# Patient Record
Sex: Female | Born: 1965 | Race: Black or African American | Hispanic: No | Marital: Married | Smoking: Never smoker
Health system: Southern US, Community
[De-identification: ages and names within clinical notes are randomized; demographics above are authoritative.]

## PROBLEM LIST (undated history)

## (undated) DIAGNOSIS — D649 Anemia, unspecified: Secondary | ICD-10-CM

## (undated) DIAGNOSIS — R0602 Shortness of breath: Secondary | ICD-10-CM

## (undated) HISTORY — DX: Anemia, unspecified: D64.9

## (undated) HISTORY — PX: TUBAL LIGATION: SHX77

---

## 1998-09-09 ENCOUNTER — Other Ambulatory Visit: Admission: RE | Admit: 1998-09-09 | Discharge: 1998-09-09 | Payer: Self-pay | Admitting: Obstetrics & Gynecology

## 1999-04-12 ENCOUNTER — Inpatient Hospital Stay (HOSPITAL_COMMUNITY): Admission: AD | Admit: 1999-04-12 | Discharge: 1999-04-15 | Payer: Self-pay | Admitting: Obstetrics & Gynecology

## 1999-04-16 ENCOUNTER — Encounter: Admission: RE | Admit: 1999-04-16 | Discharge: 1999-05-30 | Payer: Self-pay | Admitting: Obstetrics & Gynecology

## 1999-05-09 ENCOUNTER — Other Ambulatory Visit: Admission: RE | Admit: 1999-05-09 | Discharge: 1999-05-09 | Payer: Self-pay | Admitting: Obstetrics and Gynecology

## 2000-05-13 ENCOUNTER — Other Ambulatory Visit: Admission: RE | Admit: 2000-05-13 | Discharge: 2000-05-13 | Payer: Self-pay | Admitting: Obstetrics & Gynecology

## 2002-05-17 ENCOUNTER — Other Ambulatory Visit: Admission: RE | Admit: 2002-05-17 | Discharge: 2002-05-17 | Payer: Self-pay | Admitting: Obstetrics & Gynecology

## 2002-06-15 ENCOUNTER — Ambulatory Visit (HOSPITAL_COMMUNITY): Admission: RE | Admit: 2002-06-15 | Discharge: 2002-06-15 | Payer: Self-pay | Admitting: Obstetrics & Gynecology

## 2012-03-30 ENCOUNTER — Ambulatory Visit (INDEPENDENT_AMBULATORY_CARE_PROVIDER_SITE_OTHER): Payer: PRIVATE HEALTH INSURANCE | Admitting: Family Medicine

## 2012-03-30 VITALS — BP 121/87 | HR 84 | Temp 98.9°F | Resp 18 | Ht 66.5 in | Wt 236.0 lb

## 2012-03-30 DIAGNOSIS — R42 Dizziness and giddiness: Secondary | ICD-10-CM

## 2012-03-30 DIAGNOSIS — D62 Acute posthemorrhagic anemia: Secondary | ICD-10-CM

## 2012-03-30 DIAGNOSIS — D473 Essential (hemorrhagic) thrombocythemia: Secondary | ICD-10-CM

## 2012-03-30 DIAGNOSIS — Z8669 Personal history of other diseases of the nervous system and sense organs: Secondary | ICD-10-CM

## 2012-03-30 DIAGNOSIS — D649 Anemia, unspecified: Secondary | ICD-10-CM

## 2012-03-30 DIAGNOSIS — H538 Other visual disturbances: Secondary | ICD-10-CM

## 2012-03-30 LAB — POCT CBC
Granulocyte percent: 59.5 %G (ref 37–80)
Hemoglobin: 10.1 g/dL — AB (ref 12.2–16.2)
MCV: 84.3 fL (ref 80–97)
MID (cbc): 0.4 (ref 0–0.9)
Platelet Count, POC: 914 10*3/uL — AB (ref 142–424)
RBC: 3.91 M/uL — AB (ref 4.04–5.48)

## 2012-03-30 NOTE — Progress Notes (Signed)
Subjective:    Patient ID: Kristin Gates, female    DOB: 03/27/1965, 47 y.o.   MRN: [id number]  HPI Kristin Gates is a 47 y.o. female C/o lightheadedness since September of last year.  Primary care provider - Dr. Allyne Gee - saw pt. Then - had EKG and bloodwork - checked for diabetes and thyroid - told all was normal.  Lightheadedness on and off.  Notes when walking to top or bottom of 12 steps. Humming sound in ears and dizzy - has to sit down.  Occurs 2-3 times per week. No recent changes in frequency or severity - just persistent. Dizzy with first getting up at times.   Noted blurry vision -  One episode 5 days ago - lasted about 30 minutes - both eyes.  Noticed again this am - "halo" appearance or distorted of visual field - lasted about 5-10 minutes.  Wears contact lenses - last visit with optho 1 year ago.  No chest pains.  Had headache with blurry vision last Friday - not recently. No current headache, no chest pain, no palpitations.  No amaurosis fugax.   Hx of increased stress - mom is ill with cancer.  Discussed with primary provider.  Denies depressed mood.  No SI. No anhedonia.   Currently on menses - these have been normal.  No blood in stool.  No hx of anemia.   Review of Systems  Constitutional: Negative for fever and diaphoresis.  HENT: Positive for tinnitus (intermittnet  buzzing only when dizzy. ). Negative for hearing loss, neck pain, neck stiffness and ear discharge.   Eyes: Positive for visual disturbance. Negative for photophobia.  Respiratory: Negative for chest tightness and shortness of breath.   Cardiovascular: Negative for chest pain and palpitations.  Gastrointestinal: Negative for blood in stool.  Genitourinary: Negative for menstrual problem.  Neurological: Positive for dizziness, light-headedness and headaches. Negative for tremors, seizures, syncope and speech difficulty.  Psychiatric/Behavioral: Negative for suicidal ideas and dysphoric mood. The patient  is not nervous/anxious.        Objective:   Physical Exam  Vitals reviewed. Constitutional: She is oriented to person, place, and time. She appears well-developed and well-nourished. No distress.  HENT:  Head: Normocephalic and atraumatic.  Eyes: Conjunctivae normal and EOM are normal. Pupils are equal, round, and reactive to light. Right eye exhibits no nystagmus. Left eye exhibits no nystagmus.  Neck: Trachea normal. Carotid bruit is not present. No mass and no thyromegaly present.  Cardiovascular: Normal rate, regular rhythm, normal heart sounds and intact distal pulses.   No murmur heard. Pulmonary/Chest: Effort normal and breath sounds normal.  Abdominal: Soft. She exhibits no pulsatile midline mass. There is no tenderness.  Neurological: She is alert and oriented to person, place, and time. She has normal strength. She displays no tremor. No sensory deficit. She displays a negative Romberg sign. Coordination and gait normal.  Reflex Scores:      Tricep reflexes are 2+ on the right side and 2+ on the left side.      Bicep reflexes are 2+ on the right side and 2+ on the left side.      Brachioradialis reflexes are 2+ on the right side and 2+ on the left side.      Patellar reflexes are 2+ on the right side and 2+ on the left side.      Achilles reflexes are 2+ on the right side and 2+ on the left side.      Normal  heel to toe, negative pronator drift. nonfocal neuro exam.   Skin: Skin is warm and dry.  Psychiatric: She has a normal mood and affect. Her behavior is normal.      Results for orders placed in visit on 03/30/12  POCT CBC      Component Value Range   WBC 5.1  4.6 - 10.2 K/uL   Lymph, poc 1.7  0.6 - 3.4   POC LYMPH PERCENT 33.2  10 - 50 %L   MID (cbc) 0.4  0 - 0.9   POC MID % 7.3  0 - 12 %M   POC Granulocyte 3.0  2 - 6.9   Granulocyte percent 59.5  37 - 80 %G   RBC 3.91 (*) 4.04 - 5.48 M/uL   Hemoglobin 10.1 (*) 12.2 - 16.2 g/dL   HCT, POC 16.1 (*) 09.6 - 47.9  %   MCV 84.3  80 - 97 fL   MCH, POC 25.8 (*) 27 - 31.2 pg   MCHC 30.6 (*) 31.8 - 35.4 g/dL   RDW, POC 04.5     Platelet Count, POC 914 (*) 142 - 424 K/uL   MPV 7.4  0 - 99.8 fL   Further history at discussion of above CBC - has had anemia in past, took iron, low to almost having a transfusion, but has not had a transfusion, and was normal at September labs off iron.      Assessment & Plan:  Kristin Gates is a 47 y.o. female 1. Episodic lightheadedness  POCT CBC, CT Head Wo Contrast, TSH.  3-4 months of persistent, but not worsening intermittent symptoms.  Only new sx of 2 episodes of transient vision change, asymptomatic currently and nonfocal neuro exam.  Possibly due to anemia, but will check head CT tomorrow, and other labs as below. Social/family stressors, but denied depression sx's currently.   2. History of blurry vision  POCT CBC, CT Head Wo Contrast, TSH.  Er precautions discussed.  Plan to follow up with optho as well - pt to call tomorrow to schedule.   3. Anemia  Hx of anemia in past, but by history was normal in September 2013. Possible contributor to above.  Vitamin B12, Reticulocytes, Iron, IFOBT POC (occult bld, rslt in office), Pathologist smear review. Will discuss follow up after labs or can be determined by primary care provider.   4. Thrombocytosis  Pathologist smear review.    Patient Instructions  We will call you about the cat scan of the head tomorrow. If this is normal, can follow up with your primary care provider to discuss further workup as well as discuss your anemia. You should also call your eye care provider for an evaluation with your recent vision symptoms. Return to the clinic or go to the nearest emergency room if any of your symptoms worsen or new symptoms occur.

## 2012-03-30 NOTE — Patient Instructions (Addendum)
We will call you about the cat scan of the head tomorrow. If this is normal, can follow up with your primary care provider to discuss further workup as well as discuss your anemia. You should also call your eye care provider for an evaluation with your recent vision symptoms. Return to the clinic or go to the nearest emergency room if any of your symptoms worsen or new symptoms occur.

## 2012-03-31 ENCOUNTER — Telehealth: Payer: Self-pay | Admitting: Radiology

## 2012-03-31 DIAGNOSIS — R42 Dizziness and giddiness: Secondary | ICD-10-CM

## 2012-03-31 LAB — IRON: Iron: 29 ug/dL — ABNORMAL LOW (ref 42–145)

## 2012-03-31 LAB — VITAMIN B12: Vitamin B-12: 910 pg/mL (ref 211–911)

## 2012-03-31 NOTE — Telephone Encounter (Signed)
Patients MRI scan can be done [facility] Auth# [id number] good from today until 05/01/12 Programme researcher, broadcasting/film/video at [place]. [name] prefers for MRI instead of the CT scan. Patient is not claustrophobic and does not have any metal anywhere of any concern. Order placed, patient advised. Please cancel CT scan from work que. Kristin Gates

## 2012-04-02 LAB — RETICULOCYTES: Retic Ct Pct: 2.9 % — ABNORMAL HIGH (ref 0.4–2.3)

## 2012-04-08 ENCOUNTER — Ambulatory Visit
Admission: RE | Admit: 2012-04-08 | Discharge: 2012-04-08 | Disposition: A | Discharge status: Home / Self Care | Payer: PRIVATE HEALTH INSURANCE | Source: Ambulatory Visit | Attending: Family Medicine | Admitting: Family Medicine

## 2012-04-08 DIAGNOSIS — R42 Dizziness and giddiness: Secondary | ICD-10-CM

## 2012-04-14 ENCOUNTER — Ambulatory Visit
Admission: RE | Admit: 2012-04-14 | Discharge: 2012-04-14 | Disposition: A | Discharge status: Home / Self Care | Payer: PRIVATE HEALTH INSURANCE | Source: Ambulatory Visit | Attending: Family Medicine | Admitting: Family Medicine

## 2012-05-09 ENCOUNTER — Ambulatory Visit: Payer: PRIVATE HEALTH INSURANCE | Admitting: Family Medicine

## 2012-05-30 ENCOUNTER — Ambulatory Visit (INDEPENDENT_AMBULATORY_CARE_PROVIDER_SITE_OTHER): Payer: PRIVATE HEALTH INSURANCE | Admitting: Family Medicine

## 2012-05-30 ENCOUNTER — Encounter: Payer: Self-pay | Admitting: Family Medicine

## 2012-05-30 VITALS — BP 139/84 | HR 86 | Temp 98.4°F | Resp 16 | Ht 66.0 in | Wt 244.0 lb

## 2012-05-30 DIAGNOSIS — D649 Anemia, unspecified: Secondary | ICD-10-CM

## 2012-05-30 DIAGNOSIS — Z789 Other specified health status: Secondary | ICD-10-CM

## 2012-05-30 DIAGNOSIS — R42 Dizziness and giddiness: Secondary | ICD-10-CM

## 2012-05-30 DIAGNOSIS — R29818 Other symptoms and signs involving the nervous system: Secondary | ICD-10-CM

## 2012-05-30 LAB — CBC WITH DIFFERENTIAL/PLATELET
Basophils Absolute: 0 10*3/uL (ref 0.0–0.1)
Basophils Relative: 0 % (ref 0–1)
MCHC: 33.1 g/dL (ref 30.0–36.0)
Neutro Abs: 2.2 10*3/uL (ref 1.7–7.7)
Neutrophils Relative %: 60 % (ref 43–77)
Platelets: 428 10*3/uL — ABNORMAL HIGH (ref 150–400)
RDW: 19.2 % — ABNORMAL HIGH (ref 11.5–15.5)
WBC: 3.6 10*3/uL — ABNORMAL LOW (ref 4.0–10.5)

## 2012-05-30 NOTE — Patient Instructions (Addendum)
We will refer you to a neurologist and recheck your blood count today. Your should receive a call or letter about your lab results within the next week to 10 days.  You can call the counselor below to schedule appointment to discuss stress management.    [facility] Ped or [place] Scrape: [phone] Monica Becton Young: [phone]  Return to the clinic or go to the nearest emergency room if any of your symptoms worsen or new symptoms occur.

## 2012-05-30 NOTE — Progress Notes (Signed)
Subjective:    Patient ID: Kristin Gates, female    DOB: [birth year], 47 y.o.   MRN: [id number]  HPI Kristin Gates is a 48 y.o. female See 03/30/12 ov. At that time discussed longstanding dizziness. 2 episodes of transient vision change, asymptomatic at OV.  Noted to have anemia (Hgb 10.1), and thrombocytosis.  Plan to follow up with optho to eval vision change, and to follow up with her primary provider to discuss anemia and dizziness.  MRI brain 04/18/12:  Findings: Ventricle size is normal. Craniocervical junction is normal. Pituitary gland is normal in size.  Negative for acute or chronic infarct. Cerebral white matter and brainstem are normal. Negative for demyelinating disease. Negative for hemorrhage or fluid collection. Negative for mass or edema. No shift of the midline structures. Paranasal sinuses are clear.  IMPRESSION: Normal Results for orders placed in visit on 03/30/12  TSH      Result Value Range   TSH 2.169  0.350 - 4.500 uIU/mL  VITAMIN B12      Result Value Range   Vitamin B-12 910  211 - 911 pg/mL  RETICULOCYTES      Result Value Range   Retic Ct Pct 2.9 (*) 0.4 - 2.3 %   RBC. 3.69 (*) 3.87 - 5.11 MIL/uL   ABS Retic 107.0  19.0 - 186.0 K/uL  IRON      Result Value Range   Iron 29 (*) 42 - 145 ug/dL  PATHOLOGIST SMEAR REVIEW      Result Value Range   Path Review      POCT CBC      Result Value Range   WBC 5.1  4.6 - 10.2 K/uL   Lymph, poc 1.7  0.6 - 3.4   POC LYMPH PERCENT 33.2  10 - 50 %L   MID (cbc) 0.4  0 - 0.9   POC MID % 7.3  0 - 12 %M   POC Granulocyte 3.0  2 - 6.9   Granulocyte percent 59.5  37 - 80 %G   RBC 3.91 (*) 4.04 - 5.48 M/uL   Hemoglobin 10.1 (*) 12.2 - 16.2 g/dL   HCT, POC 78.2 (*) 95.6 - 47.9 %   MCV 84.3  80 - 97 fL   MCH, POC 25.8 (*) 27 - 31.2 pg   MCHC 30.6 (*) 31.8 - 35.4 g/dL   RDW, POC 21.3     Platelet Count, POC 914 (*) 142 - 424 K/uL   MPV 7.4  0 - 99.8 fL   Anemia - see last ov. Plan to restart iron QD. Started on  Vytron C - otc iron supplement - unknown mg - taking once per day since last ov.  Next appt with Dr. Allyne Gee April 22nd, but had only established care 1 prior visit in 9/13. Still feels dizzy at times.  No further vision changes.  Notes at times with going up and down stairs multiple times, but not at rest and not with walking across parking lot. No DOE. Still feels fatigued, and feels like muscles in legs are heavy/fatigued. No hand weakness able to grasp, but feels like less control when writing or using a pen. These sx's for months - comes and goes, feels like different. No chest pain or heaviness.   Denies depression, but some stress with job. Mom is sick. Busy with normal activities, no SI, or concerns for safety.  No illicit drug use, no alcohol. Rare crying, but feels may be overloaded.  Review of Systems  Constitutional: Negative for fever.  Eyes: Negative for visual disturbance.  Respiratory: Negative for chest tightness and shortness of breath.   Cardiovascular: Negative for chest pain.  Neurological: Positive for dizziness. Negative for tremors, seizures, syncope, facial asymmetry, speech difficulty and weakness (no focal weakness. ).       Objective:   Physical Exam  Vitals reviewed. Constitutional: She is oriented to person, place, and time. She appears well-developed and well-nourished.  HENT:  Head: Normocephalic and atraumatic.  Eyes: Conjunctivae and EOM are normal. Pupils are equal, round, and reactive to light. Right eye exhibits no nystagmus. Left eye exhibits no nystagmus.  Neck: Carotid bruit is not present.  Cardiovascular: Normal rate, regular rhythm, normal heart sounds and intact distal pulses.   Pulmonary/Chest: Effort normal and breath sounds normal.  Abdominal: Soft. She exhibits no pulsatile midline mass. There is no tenderness.  Neurological: She is alert and oriented to person, place, and time. She has normal strength. No sensory deficit. She displays a  negative Romberg sign. Coordination and gait normal.  Normal heel to toe, normal finger to nose and heel slide up up shin, normal RAM's. No pronator drift. Non focal.   Skin: Skin is warm and dry.  Psychiatric: She has a normal mood and affect. Her behavior is normal.          Assessment & Plan:  Kristin Gates is a 47 y.o. female Anemia - Plan: CBC with Differential.  Continue iron each day for now. Keep follow up with PCP or me in next month.   Dizziness and giddiness - Plan: CBC with Differential, Ambulatory referral to Neurology, Fine motor skill loss - Plan: Ambulatory referral to Neurology.  Reassuring exam and MRI, but will have neuro eval.   Possible stress component - plan to setup counseling as below.   rtc precautions.     Patient Instructions  We will refer you to a neurologist and recheck your blood count today. Your should receive a call or letter about your lab results within the next week to 10 days.  You can call the counselor below to schedule appointment to discuss stress management.    [facility] Ped or [place] Scrape: [phone] Monica Becton Young: [phone]  Return to the clinic or go to the nearest emergency room if any of your symptoms worsen or new symptoms occur.

## 2012-06-03 ENCOUNTER — Other Ambulatory Visit: Payer: Self-pay

## 2012-06-03 DIAGNOSIS — Z1231 Encounter for screening mammogram for malignant neoplasm of breast: Secondary | ICD-10-CM

## 2012-06-21 ENCOUNTER — Encounter: Payer: Self-pay | Admitting: Diagnostic Neuroimaging

## 2012-06-21 ENCOUNTER — Ambulatory Visit (INDEPENDENT_AMBULATORY_CARE_PROVIDER_SITE_OTHER): Payer: PRIVATE HEALTH INSURANCE | Admitting: Diagnostic Neuroimaging

## 2012-06-21 VITALS — BP 125/88 | HR 82 | Temp 98.5°F | Ht 65.5 in | Wt 243.0 lb

## 2012-06-21 DIAGNOSIS — R5381 Other malaise: Secondary | ICD-10-CM

## 2012-06-21 DIAGNOSIS — R42 Dizziness and giddiness: Secondary | ICD-10-CM

## 2012-06-21 DIAGNOSIS — R5383 Other fatigue: Secondary | ICD-10-CM

## 2012-06-21 NOTE — Progress Notes (Signed)
[facility]  PATIENT: Kristin Gates DOB: 09-Nov-1965  REFERRING CLINICIAN: Neva Seat HISTORY FROM: patient REASON FOR VISIT: new consult   HISTORICAL  CHIEF COMPLAINT:  Chief Complaint  Patient presents with  . Dizziness    HISTORY OF PRESENT ILLNESS:   47 year old right-handed female with iron deficiency anemia, here for evaluation of lightheadedness, muscle weakness, tremor, fatigue. Symptoms have been present for approximately one year. She is noted change in her handwriting, fine finger movements, generalized muscle weakness with walking and getting up and down from her chair. She also has intermittent lightheadedness when she stands up. She denies decreased by mouth intake with dehydration.  In January or February 2014, patient had a single episode of visual distortion, while talking with a Radio broadcast assistant. It looked as though she was looking through water running down the piece of glass. His lasted for a few minutes. She had headache during the stay. No nausea, vomiting, photophobia, phonophobia or dizziness.  Patient has had MRI of the brain which was normal. TSH, B12 also normal.  REVIEW OF SYSTEMS: Full 14 system review of systems performed and notable only for fatigue hearing loss ringing in the ears distorted vision shortness of breath anemia cramps weakness slurred speech tremor anxiety decreased energy.  ALLERGIES: No Known Allergies  HOME MEDICATIONS: Outpatient Prescriptions Prior to Visit  Medication Sig Dispense Refill  . Iron-Vitamin C (VITRON-C PO) Take by mouth.      . magnesium 30 MG tablet Take 30 mg by mouth 2 (two) times daily.      . Multiple Vitamin (MULTIVITAMIN) tablet Take 1 tablet by mouth daily.       No facility-administered medications prior to visit.    PAST MEDICAL HISTORY: Past Medical History  Diagnosis Date  . Anemia     PAST SURGICAL HISTORY: Past Surgical History  Procedure Laterality Date  . Cesarean section      . Tubal ligation      FAMILY HISTORY: Family History  Problem Relation Age of Onset  . Cancer Mother   . Hypertension Mother   . Heart disease Father   . Hypertension Father   . Diabetes Father   . Heart failure Father   . Asthma Sister     SOCIAL HISTORY:  History   Social History  . Marital Status: Married    Spouse Name: N/A    Number of Children: 1  . Years of Education: college    Occupational History  . collector     [employer]   Social History Main Topics  . Smoking status: Never Smoker   . Smokeless tobacco: Not on file  . Alcohol Use: 1 - 1.5 oz/week    2-3 drink(s) per week     Comment: once or twice a month  . Drug Use: No  . Sexually Active: Yes    Birth Control/ Protection: None   Other Topics Concern  . Not on file   Social History Narrative   Pt lives at home with her spouse and daughter.   Caffeine Use- 6 to 8oz's daily     PHYSICAL EXAM  Filed Vitals:   06/21/12 0911 06/21/12 0923  BP:  125/88  Pulse:  82  Temp: 98.5 F (36.9 C)   TempSrc: Oral   Height: 5' 5.5" (1.664 m)   Weight: 243 lb (110.224 kg)    Body mass index is 39.81 kg/(m^2).  GENERAL EXAM: Patient is in no distress  CARDIOVASCULAR: Regular rate and rhythm, no murmurs,  no carotid bruits  NEUROLOGIC: MENTAL STATUS: awake, alert, language fluent, comprehension intact, naming intact CRANIAL NERVE: no papilledema on fundoscopic exam, pupils equal and reactive to light, visual fields full to confrontation, extraocular muscles intact, no nystagmus, facial sensation and strength symmetric, uvula midline, shoulder shrug symmetric, tongue midline. MOTOR: MINIMAL POSTURAL TREMOR. Normal bulk and tone, full strength in the BUE, BLE SENSORY: normal and symmetric to light touch, pinprick, temperature, vibration COORDINATION: finger-nose-finger, fine finger movements normal REFLEXES: deep tendon reflexes present and symmetric GAIT/STATION: narrow based gait; able to walk on  toes, heels and tandem; romberg is negative   DIAGNOSTIC DATA (LABS, IMAGING, TESTING) - I reviewed patient records, labs, notes, testing and imaging myself where available.  Lab Results  Component Value Date   WBC 3.6* 05/30/2012   HGB 11.7* 05/30/2012   HCT 35.3* 05/30/2012   MCV 84.4 05/30/2012   PLT 428* 05/30/2012   No results found for this basename: na, k, cl, co2, glucose, bun, creatinine, calcium, prot, albumin, ast, alt, alkphos, bilitot, gfrnonaa, gfraa   No results found for this basename: CHOL, HDL, LDLCALC, LDLDIRECT, TRIG, CHOLHDL   No results found for this basename: HGBA1C   Lab Results  Component Value Date   VITAMINB12 910 03/30/2012   Lab Results  Component Value Date   TSH 2.169 03/30/2012   04/14/12 MRI brain - normal  ASSESSMENT AND PLAN  47 y.o. year old female  has a past medical history of Anemia. here with constellation of symptoms including transient visual distortion, generalized fatigue, intermittent dizziness. Her visual distortion episode sounds like migraine phenomenon. Her generalized fatigue may be related to iron deficiency anemia. She had 16 point drop in systolic blood pressure with standing with appropriate compensatory tachycardia. I will check some blood work to look for other causes for weakness. Overall MRI and neurologic examination are reassuring.  I encouraged patient to increase her water intake and gradually increase her physical activity.  Orders Placed This Encounter  Procedures  . CK  . Aldolase  . Acetylcholine Receptor, Binding      Suanne Marker, MD 06/21/2012, 9:45 AM Certified in Neurology, Neurophysiology and Neuroimaging  [facility] [address], [address] [place], [place] [address] [phone]

## 2012-06-21 NOTE — Patient Instructions (Signed)
Stay hydrated.  Gradually increase physical activity to improve stamina/endurance.

## 2012-06-22 LAB — CK: Total CK: 74 U/L (ref 24–173)

## 2012-06-22 LAB — ALDOLASE: Aldolase: 3.2 U/L (ref 1.2–7.6)

## 2012-07-07 ENCOUNTER — Ambulatory Visit
Admission: RE | Admit: 2012-07-07 | Discharge: 2012-07-07 | Disposition: A | Discharge status: Home / Self Care | Payer: PRIVATE HEALTH INSURANCE | Source: Ambulatory Visit

## 2012-07-07 DIAGNOSIS — Z1231 Encounter for screening mammogram for malignant neoplasm of breast: Secondary | ICD-10-CM

## 2012-07-11 ENCOUNTER — Other Ambulatory Visit: Payer: Self-pay | Admitting: Obstetrics & Gynecology

## 2012-07-11 DIAGNOSIS — R928 Other abnormal and inconclusive findings on diagnostic imaging of breast: Secondary | ICD-10-CM

## 2012-07-20 ENCOUNTER — Ambulatory Visit
Admission: RE | Admit: 2012-07-20 | Discharge: 2012-07-20 | Disposition: A | Discharge status: Home / Self Care | Payer: PRIVATE HEALTH INSURANCE | Source: Ambulatory Visit | Attending: Obstetrics & Gynecology | Admitting: Obstetrics & Gynecology

## 2012-07-20 DIAGNOSIS — R928 Other abnormal and inconclusive findings on diagnostic imaging of breast: Secondary | ICD-10-CM

## 2012-08-10 ENCOUNTER — Ambulatory Visit
Admission: RE | Admit: 2012-08-10 | Discharge: 2012-08-10 | Disposition: A | Discharge status: Home / Self Care | Payer: PRIVATE HEALTH INSURANCE | Source: Ambulatory Visit | Attending: Cardiology | Admitting: Cardiology

## 2012-08-10 ENCOUNTER — Other Ambulatory Visit: Payer: Self-pay | Admitting: Cardiology

## 2012-08-10 DIAGNOSIS — I272 Pulmonary hypertension, unspecified: Secondary | ICD-10-CM

## 2012-08-10 DIAGNOSIS — R0602 Shortness of breath: Secondary | ICD-10-CM

## 2012-08-21 ENCOUNTER — Ambulatory Visit (INDEPENDENT_AMBULATORY_CARE_PROVIDER_SITE_OTHER): Payer: PRIVATE HEALTH INSURANCE | Admitting: Family Medicine

## 2012-08-21 ENCOUNTER — Encounter: Payer: Self-pay | Admitting: Family Medicine

## 2012-08-21 VITALS — BP 130/80 | HR 90 | Temp 98.0°F | Resp 16 | Ht 65.5 in | Wt 241.0 lb

## 2012-08-21 DIAGNOSIS — M25562 Pain in left knee: Secondary | ICD-10-CM

## 2012-08-21 DIAGNOSIS — M79609 Pain in unspecified limb: Secondary | ICD-10-CM

## 2012-08-21 MED ORDER — PREDNISONE 20 MG PO TABS: ORAL_TABLET | ORAL | Status: DC

## 2012-08-21 NOTE — Progress Notes (Signed)
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  Some crepitus.  No trauma.  No past hx of problem there.  No fever or other joint sx.  Ibuprofen not helping  Objective:  NAD Knee inspection and palpation are normal ROM is limited to 0 to 120 degrees Some crepitus is palpable behind patella Lig testing is neg. No effusion  Knee pain, acute, left - Plan: predniSONE (DELTASONE) 20 MG tablet  Signed, Elvina Sidle, MD

## 2012-09-08 ENCOUNTER — Telehealth: Payer: Self-pay | Admitting: Diagnostic Neuroimaging

## 2012-10-19 ENCOUNTER — Encounter (HOSPITAL_COMMUNITY): Payer: Self-pay | Admitting: Pharmacy Technician

## 2012-11-14 ENCOUNTER — Other Ambulatory Visit: Payer: Self-pay | Admitting: Obstetrics & Gynecology

## 2012-11-28 ENCOUNTER — Encounter (HOSPITAL_COMMUNITY): Payer: Self-pay

## 2012-11-28 ENCOUNTER — Encounter (HOSPITAL_COMMUNITY)
Admission: RE | Admit: 2012-11-28 | Discharge: 2012-11-28 | Disposition: A | Discharge status: Home / Self Care | Payer: PRIVATE HEALTH INSURANCE | Source: Ambulatory Visit | Attending: Obstetrics & Gynecology | Admitting: Obstetrics & Gynecology

## 2012-11-28 DIAGNOSIS — Z01818 Encounter for other preprocedural examination: Secondary | ICD-10-CM | POA: Insufficient documentation

## 2012-11-28 DIAGNOSIS — Z01812 Encounter for preprocedural laboratory examination: Secondary | ICD-10-CM | POA: Insufficient documentation

## 2012-11-28 HISTORY — DX: Shortness of breath: R06.02

## 2012-11-28 LAB — CBC
HCT: 36.9 % (ref 36.0–46.0)
Hemoglobin: 12.2 g/dL (ref 12.0–15.0)
MCH: 30.8 pg (ref 26.0–34.0)
MCV: 93.2 fL (ref 78.0–100.0)
RBC: 3.96 MIL/uL (ref 3.87–5.11)

## 2012-11-28 NOTE — Patient Instructions (Addendum)
Your procedure is scheduled on:12/02/12  Enter through the Main Entrance at :6am Pick up desk phone and dial [phone] and inform us of your arrival.  Please call [phone] if you have any problems the morning of surgery.  Remember: Do not eat food or drink liquids, including water, after midnight Thursday  You may brush your teeth the morning of surgery.    DO NOT wear jewelry, eye make-up, lipstick,body lotion, or dark fingernail polish.  (Polished toes are ok) You may wear deodorant.  If you are to be admitted after surgery, leave suitcase in car until your room has been assigned. Patients discharged on the day of surgery will not be allowed to drive home. Wear loose fitting, comfortable clothes for your ride home.

## 2012-12-02 ENCOUNTER — Encounter (HOSPITAL_COMMUNITY)
Admission: RE | Disposition: A | Discharge status: Home / Self Care | Payer: Self-pay | Source: Ambulatory Visit | Attending: Obstetrics & Gynecology

## 2012-12-02 ENCOUNTER — Ambulatory Visit (HOSPITAL_COMMUNITY): Payer: PRIVATE HEALTH INSURANCE | Admitting: Anesthesiology

## 2012-12-02 ENCOUNTER — Ambulatory Visit (HOSPITAL_COMMUNITY)
Admission: RE | Admit: 2012-12-02 | Discharge: 2012-12-02 | Disposition: A | Discharge status: Home / Self Care | Payer: PRIVATE HEALTH INSURANCE | Source: Ambulatory Visit | Attending: Obstetrics & Gynecology | Admitting: Obstetrics & Gynecology

## 2012-12-02 ENCOUNTER — Encounter (HOSPITAL_COMMUNITY): Payer: Self-pay | Admitting: Anesthesiology

## 2012-12-02 DIAGNOSIS — N840 Polyp of corpus uteri: Secondary | ICD-10-CM | POA: Insufficient documentation

## 2012-12-02 DIAGNOSIS — N921 Excessive and frequent menstruation with irregular cycle: Secondary | ICD-10-CM | POA: Insufficient documentation

## 2012-12-02 HISTORY — PX: DILITATION & CURRETTAGE/HYSTROSCOPY WITH VERSAPOINT RESECTION: SHX5571

## 2012-12-02 SURGERY — DILATATION & CURETTAGE/HYSTEROSCOPY WITH VERSAPOINT RESECTION
Anesthesia: General | Site: Vagina | Wound class: Clean Contaminated

## 2012-12-02 MED ORDER — MIDAZOLAM HCL 2 MG/2ML IJ SOLN
INTRAMUSCULAR | Status: AC
  Filled 2012-12-02: qty 2

## 2012-12-02 MED ORDER — SODIUM CHLORIDE 0.9 % IR SOLN
Status: DC | PRN
  Administered 2012-12-02: 3000 mL

## 2012-12-02 MED ORDER — PROPOFOL 10 MG/ML IV EMUL
INTRAVENOUS | Status: AC
  Filled 2012-12-02: qty 20

## 2012-12-02 MED ORDER — FENTANYL CITRATE 0.05 MG/ML IJ SOLN
INTRAMUSCULAR | Status: AC
  Filled 2012-12-02: qty 2

## 2012-12-02 MED ORDER — PROPOFOL 10 MG/ML IV BOLUS
INTRAVENOUS | Status: DC | PRN
  Administered 2012-12-02: 170 mg via INTRAVENOUS

## 2012-12-02 MED ORDER — KETOROLAC TROMETHAMINE 30 MG/ML IJ SOLN
INTRAMUSCULAR | Status: AC
  Filled 2012-12-02: qty 1

## 2012-12-02 MED ORDER — KETOROLAC TROMETHAMINE 30 MG/ML IJ SOLN
INTRAMUSCULAR | Status: DC | PRN
  Administered 2012-12-02: 30 mg via INTRAVENOUS

## 2012-12-02 MED ORDER — PHENYLEPHRINE 40 MCG/ML (10ML) SYRINGE FOR IV PUSH (FOR BLOOD PRESSURE SUPPORT)
PREFILLED_SYRINGE | INTRAVENOUS | Status: AC
  Filled 2012-12-02: qty 5

## 2012-12-02 MED ORDER — LACTATED RINGERS IV SOLN
INTRAVENOUS | Status: DC
  Administered 2012-12-02 (×2): via INTRAVENOUS

## 2012-12-02 MED ORDER — FENTANYL CITRATE 0.05 MG/ML IJ SOLN
INTRAMUSCULAR | Status: DC | PRN
  Administered 2012-12-02 (×2): 50 ug via INTRAVENOUS

## 2012-12-02 MED ORDER — CHLOROPROCAINE HCL 1 % IJ SOLN
INTRAMUSCULAR | Status: DC | PRN
  Administered 2012-12-02: 20 mL

## 2012-12-02 MED ORDER — FENTANYL CITRATE 0.05 MG/ML IJ SOLN: 25.0000 ug | INTRAMUSCULAR | Status: DC | PRN

## 2012-12-02 MED ORDER — MIDAZOLAM HCL 2 MG/2ML IJ SOLN
INTRAMUSCULAR | Status: DC | PRN
  Administered 2012-12-02: 2 mg via INTRAVENOUS

## 2012-12-02 MED ORDER — CEFAZOLIN SODIUM-DEXTROSE 2-3 GM-% IV SOLR
INTRAVENOUS | Status: AC
  Filled 2012-12-02: qty 50

## 2012-12-02 MED ORDER — LIDOCAINE HCL (CARDIAC) 20 MG/ML IV SOLN
INTRAVENOUS | Status: DC | PRN
  Administered 2012-12-02: 30 mg via INTRAVENOUS
  Administered 2012-12-02: 70 mg via INTRAVENOUS

## 2012-12-02 MED ORDER — ONDANSETRON HCL 4 MG/2ML IJ SOLN
INTRAMUSCULAR | Status: AC
  Filled 2012-12-02: qty 2

## 2012-12-02 MED ORDER — LIDOCAINE HCL (CARDIAC) 20 MG/ML IV SOLN
INTRAVENOUS | Status: AC
  Filled 2012-12-02: qty 5

## 2012-12-02 MED ORDER — OXYCODONE-ACETAMINOPHEN 7.5-325 MG PO TABS: 1.0000 | ORAL_TABLET | ORAL | Status: DC | PRN

## 2012-12-02 MED ORDER — CEFAZOLIN SODIUM-DEXTROSE 2-3 GM-% IV SOLR
2.0000 g | INTRAVENOUS | Status: AC
  Administered 2012-12-02: 2 g via INTRAVENOUS

## 2012-12-02 MED ORDER — PHENYLEPHRINE HCL 10 MG/ML IJ SOLN
INTRAMUSCULAR | Status: DC | PRN
  Administered 2012-12-02: 80 ug via INTRAVENOUS
  Administered 2012-12-02: 40 ug via INTRAVENOUS
  Administered 2012-12-02: 80 ug via INTRAVENOUS

## 2012-12-02 MED ORDER — CHLOROPROCAINE HCL 1 % IJ SOLN
INTRAMUSCULAR | Status: AC
  Filled 2012-12-02: qty 30

## 2012-12-02 MED ORDER — ONDANSETRON HCL 4 MG/2ML IJ SOLN
INTRAMUSCULAR | Status: DC | PRN
  Administered 2012-12-02: 4 mg via INTRAVENOUS

## 2012-12-02 SURGICAL SUPPLY — 15 items
CANISTER SUCTION 2500CC (MISCELLANEOUS) ×2 IMPLANT
CATH ROBINSON RED A/P 16FR (CATHETERS) ×2 IMPLANT
CLOTH BEACON ORANGE TIMEOUT ST (SAFETY) ×2 IMPLANT
CONTAINER PREFILL 10% NBF 60ML (FORM) ×4 IMPLANT
DRESSING TELFA 8X3 (GAUZE/BANDAGES/DRESSINGS) ×2 IMPLANT
ELECTRODE ROLLER VERSAPOINT (ELECTRODE) ×2 IMPLANT
ELECTRODE RT ANGLE VERSAPOINT (CUTTING LOOP) ×2 IMPLANT
GLOVE BIO SURGEON STRL SZ 6.5 (GLOVE) ×2 IMPLANT
GLOVE BIOGEL PI IND STRL 7.0 (GLOVE) ×2 IMPLANT
GLOVE BIOGEL PI INDICATOR 7.0 (GLOVE) ×2
GOWN STRL REIN XL XLG (GOWN DISPOSABLE) ×4 IMPLANT
PACK HYSTEROSCOPY LF (CUSTOM PROCEDURE TRAY) ×2 IMPLANT
PAD OB MATERNITY 4.3X12.25 (PERSONAL CARE ITEMS) ×2 IMPLANT
TOWEL OR 17X24 6PK STRL BLUE (TOWEL DISPOSABLE) ×4 IMPLANT
WATER STERILE IRR 1000ML POUR (IV SOLUTION) ×2 IMPLANT

## 2012-12-02 NOTE — Anesthesia Preprocedure Evaluation (Addendum)
Anesthesia Evaluation  Patient identified by MRN, date of birth, ID band Patient awake    Reviewed: Allergy & Precautions, H&P , Patient's Chart, lab work & pertinent test results, reviewed documented beta blocker date and time   Airway Mallampati: II TM Distance: >3 FB Neck ROM: full    Dental no notable dental hx.    Pulmonary  breath sounds clear to auscultation  Pulmonary exam normal       Cardiovascular Rhythm:regular Rate:Normal     Neuro/Psych    GI/Hepatic   Endo/Other  Morbid obesity  Renal/GU      Musculoskeletal   Abdominal   Peds  Hematology   Anesthesia Other Findings   Reproductive/Obstetrics                           Anesthesia Physical Anesthesia Plan  ASA: III  Anesthesia Plan:    Post-op Pain Management:    Induction: Intravenous  Airway Management Planned: LMA  Additional Equipment:   Intra-op Plan:   Post-operative Plan:   Informed Consent: I have reviewed the patients History and Physical, chart, labs and discussed the procedure including the risks, benefits and alternatives for the proposed anesthesia with the patient or authorized representative who has indicated his/her understanding and acceptance.   Dental Advisory Given and Dental advisory given  Plan Discussed with: CRNA and Surgeon  Anesthesia Plan Comments:         Anesthesia Quick Evaluation

## 2012-12-02 NOTE — Op Note (Signed)
12/02/2012  8:37 AM  PATIENT:  Kristin Gates  47 y.o. female  PRE-OPERATIVE DIAGNOSIS:  Endometrial Polyps  [phone]  POST-OPERATIVE DIAGNOSIS:  Endometrial polyps  PROCEDURE:  Procedure(s): DILATATION & CURETTAGE/HYSTEROSCOPY WITH VERSAPOINT RESECTION  SURGEON:  Surgeon(s): Genia Del, MD  ASSISTANTS: none   ANESTHESIA:   general  PROCEDURE:  Under general anesthesia with laryngeal mask the patient is in lithotomy position. She is prepped with Betadine on the suprapubic, vulvar and vaginal areas. She is draped as usual. The vaginal exam reveals a retroverted uterus, no adnexal mass.  The speculum is inserted in the vagina and the anterior lip of the cervix is grasped with a tenaculum. A paracervical block is done with Nesacaine 1% a total of 20 cc at 4 and 8:00. Dilation of the cervix with Hegar dilators up to #33 without difficulty. The operative hysteroscope was introduced in the intrauterine cavity with the versapoint loupe.  Pictures were taken of the intrauterine cavity including the ostia and the anterior endometrial polyps. The endometrium was thick especially on the anterior aspect of the intrauterine cavity.  The intrauterine cavity was otherwise normal. The endometrial polyps were resected as well as the areas of increased thickness. We then removed the hysteroscope and proceeded with a systematic endometrial curettage on all endometrial surfaces with a sharp curet. The 2 specimens were sent together to pathology.  We then went back with the hysteroscope and I took pictures of the normal intrauterine cavity. Hemostasis was adequate. All instruments were removed. Hemostasis was adequate on the cervix. The patient was brought to recovery room in good and stable status.  ESTIMATED BLOOD LOSS: 25 cc Fluid deficit 125 cc   Intake/Output Summary (Last 24 hours) at 12/02/12 0837 Last data filed at 12/02/12 0815  Gross per 24 hour  Intake   1300 ml  Output    140 ml  Net    1160 ml     BLOOD ADMINISTERED:none   LOCAL MEDICATIONS USED:  Nesacaine Amount: 20 ml  SPECIMEN:  Source of Specimen:  endometrial resection and curettings  DISPOSITION OF SPECIMEN:  PATHOLOGY  COUNTS:  YES  PLAN OF CARE: Transfer to PACU    Genia Del MD  12/02/2012  At 8:37 am

## 2012-12-02 NOTE — Transfer of Care (Signed)
Immediate Anesthesia Transfer of Care Note  Patient: Kristin Gates  Procedure(s) Performed: Procedure(s): DILATATION & CURETTAGE/HYSTEROSCOPY WITH VERSAPOINT RESECTION (N/A)  Patient Location: PACU  Anesthesia Type:General  Level of Consciousness: awake, sedated and patient cooperative  Airway & Oxygen Therapy: Patient Spontanous Breathing and Patient connected to nasal cannula oxygen  Post-op Assessment: Report given to PACU RN and Post -op Vital signs reviewed and stable  Post vital signs: Reviewed and stable  Complications: No apparent anesthesia complications

## 2012-12-02 NOTE — H&P (Signed)
Kristin Gates is an 47 y.o. female G18P1A1  RP:  2 endometrial polyps  Pertinent Gynecological History: Menses: flow is moderate Bleeding: intermenstrual bleeding Contraception: BT/S Blood transfusions: none Sexually transmitted diseases: no past history Previous GYN Procedures: BT/S  Last mammogram: normal  Last pap: normal  OB History: G2P1A1   Menstrual History:  No LMP recorded.    Past Medical History  Diagnosis Date  . Anemia   . Shortness of breath     from anemia    Past Surgical History  Procedure Laterality Date  . Cesarean section    . Tubal ligation      Family History  Problem Relation Age of Onset  . Cancer Mother   . Hypertension Mother   . Heart disease Father   . Hypertension Father   . Diabetes Father   . Heart failure Father   . Asthma Sister     Social History:  reports that she has never smoked. She does not have any smokeless tobacco history on file. She reports that she drinks about 1.0 ounces of alcohol per week. She reports that she does not use illicit drugs.  Allergies: No Known Allergies  Prescriptions prior to admission  Medication Sig Dispense Refill  . Iron-Vitamin C (VITRON-C PO) Take 1 tablet by mouth.       . magnesium 30 MG tablet Take 30 mg by mouth daily.       . Multiple Vitamin (MULTIVITAMIN) tablet Take 1 tablet by mouth daily.      . naproxen sodium (ANAPROX) 220 MG tablet Take 220 mg by mouth as needed (Knee pain).        @ROS @  Blood pressure 117/72, pulse 97, temperature 98.4 F (36.9 C), temperature source Oral, resp. rate 20, SpO2 99.00%.  [place]:  2 ant. Endometrial lesions, prob. Polyps.  Results for orders placed during the hospital encounter of 12/02/12 (from the past 24 hour(s))  PREGNANCY, URINE     Status: None   Collection Time    12/02/12  5:50 AM      Result Value Range   Preg Test, Ur NEGATIVE  NEGATIVE    No results found.  Assessment/Plan: Endometrial polyps for HSC resection,  D+C.  Surgery and risks reviewed.  Kristin Gates,MARIE-LYNE 12/02/2012, 7:30 AM

## 2012-12-02 NOTE — Discharge Summary (Signed)
  Physician Discharge Summary  Patient ID: Kristin Gates MRN: [id number] DOB/AGE: 1965-08-02 47 y.o.  Admit date: 12/02/2012 Discharge date: 12/02/2012  Admission Diagnoses: Endometrial Polyps  58558  Discharge Diagnoses: Endometrial Polyps  40981        Active Problems:   * No active hospital problems. *   Discharged Condition: good  Hospital Course: Outpatient  Consults: None  Treatments: surgery: Hysteroscopy with Versapoint resection, D+C  Disposition:    Future Appointments Provider Department Dept Phone   01/11/2013 8:30 AM Suanne Marker, MD [facility] [phone]       Medication List         magnesium 30 MG tablet  Take 30 mg by mouth daily.     multivitamin tablet  Take 1 tablet by mouth daily.     naproxen sodium 220 MG tablet  Commonly known as:  ANAPROX  Take 220 mg by mouth as needed (Knee pain).     oxyCODONE-acetaminophen 7.5-325 MG per tablet  Commonly known as:  PERCOCET  Take 1 tablet by mouth every 4 (four) hours as needed for pain.     VITRON-C PO  Take 1 tablet by mouth.           Follow-up Information   Follow up with Adden Strout,MARIE-LYNE, MD In 3 weeks.   Specialty:  Obstetrics and Gynecology   Contact information:   [address] [place] [address] [phone]       Signed: Genia Del, MD 12/02/2012, 8:46 AM

## 2012-12-02 NOTE — Anesthesia Postprocedure Evaluation (Signed)
  Anesthesia Post Note  Patient: Kristin Gates  Procedure(s) Performed: Procedure(s) (LRB): DILATATION & CURETTAGE/HYSTEROSCOPY WITH VERSAPOINT RESECTION (N/A)  Anesthesia type: GA  Patient location: PACU  Post pain: Pain level controlled  Post assessment: Post-op Vital signs reviewed  Last Vitals:  Filed Vitals:   12/02/12 0900  BP: 132/77  Pulse: 84  Temp:   Resp: 14    Post vital signs: Reviewed  Level of consciousness: sedated  Complications: No apparent anesthesia complications

## 2012-12-05 ENCOUNTER — Encounter (HOSPITAL_COMMUNITY): Payer: Self-pay | Admitting: Obstetrics & Gynecology

## 2013-01-11 ENCOUNTER — Encounter: Payer: Self-pay | Admitting: Diagnostic Neuroimaging

## 2013-01-11 ENCOUNTER — Encounter (INDEPENDENT_AMBULATORY_CARE_PROVIDER_SITE_OTHER): Payer: Self-pay

## 2013-01-11 ENCOUNTER — Ambulatory Visit (INDEPENDENT_AMBULATORY_CARE_PROVIDER_SITE_OTHER): Payer: PRIVATE HEALTH INSURANCE | Admitting: Diagnostic Neuroimaging

## 2013-01-11 VITALS — BP 98/66 | HR 99 | Ht 65.5 in | Wt 237.5 lb

## 2013-01-11 DIAGNOSIS — R251 Tremor, unspecified: Secondary | ICD-10-CM

## 2013-01-11 DIAGNOSIS — R6889 Other general symptoms and signs: Secondary | ICD-10-CM

## 2013-01-11 DIAGNOSIS — R259 Unspecified abnormal involuntary movements: Secondary | ICD-10-CM

## 2013-01-11 DIAGNOSIS — R258 Other abnormal involuntary movements: Secondary | ICD-10-CM

## 2013-01-11 DIAGNOSIS — R29898 Other symptoms and signs involving the musculoskeletal system: Secondary | ICD-10-CM

## 2013-01-11 NOTE — Progress Notes (Signed)
[facility]  PATIENT: Kristin Gates DOB: 10-06-1965  REFERRING CLINICIAN: Neva Seat HISTORY FROM: patient REASON FOR VISIT: new consult   HISTORICAL  CHIEF COMPLAINT:  Chief Complaint  Patient presents with  . Follow-up    dizziness and giddiness    HISTORY OF PRESENT ILLNESS:   UPDATE 01/11/13: Since last visit, right arm tremor, incoordination, and stiffness have worsened. Right leg feels stiff and heavy. Her right arm and leg "don't do what I want them to do" in her words. Fatigue, dizziness and generalized weakness have continued as well. She is scheduled to see pulmonary and have sleep study soon.  PRIOR HPI (06/21/12): 47 year old right-handed female with iron deficiency anemia, here for evaluation of lightheadedness, muscle weakness, tremor, fatigue. Symptoms have been present for approximately one year. She is noted change in her handwriting, fine finger movements, generalized muscle weakness with walking and getting up and down from her chair. She also has intermittent lightheadedness when she stands up. She denies decreased by mouth intake with dehydration.  In January or February 2014, patient had a single episode of visual distortion, while talking with a Radio broadcast assistant. It looked as though she was looking through water running down the piece of glass. His lasted for a few minutes. She had headache during the stay. No nausea, vomiting, photophobia, phonophobia or dizziness.  Patient has had MRI of the brain which was normal. TSH, B12 also normal.  REVIEW OF SYSTEMS: Full 14 system review of systems performed and notable only for fatigue shortness of breath snoring cramps anemia.  ALLERGIES: No Known Allergies  HOME MEDICATIONS: Outpatient Prescriptions Prior to Visit  Medication Sig Dispense Refill  . naproxen sodium (ANAPROX) 220 MG tablet Take 220 mg by mouth as needed (Knee pain).      . Iron-Vitamin C (VITRON-C PO) Take 1 tablet by mouth.       .  magnesium 30 MG tablet Take 30 mg by mouth daily.       . Multiple Vitamin (MULTIVITAMIN) tablet Take 1 tablet by mouth daily.      Marland Kitchen oxyCODONE-acetaminophen (PERCOCET) 7.5-325 MG per tablet Take 1 tablet by mouth every 4 (four) hours as needed for pain.  30 tablet  0   No facility-administered medications prior to visit.    PAST MEDICAL HISTORY: Past Medical History  Diagnosis Date  . Anemia   . Shortness of breath     from anemia    PAST SURGICAL HISTORY: Past Surgical History  Procedure Laterality Date  . Cesarean section    . Tubal ligation    . Dilitation & currettage/hystroscopy with versapoint resection N/A 12/02/2012    Procedure: DILATATION & CURETTAGE/HYSTEROSCOPY WITH VERSAPOINT RESECTION;  Surgeon: Genia Del, MD;  Location: [facility];  Service: Gynecology;  Laterality: N/A;    FAMILY HISTORY: Family History  Problem Relation Age of Onset  . Cancer Mother   . Hypertension Mother   . Heart disease Father   . Hypertension Father   . Diabetes Father   . Heart failure Father   . Asthma Sister     SOCIAL HISTORY:  History   Social History  . Marital Status: Married    Spouse Name: Dimas Aguas    Number of Children: 1  . 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     Social History Main Topics  . Smoking status: Never Smoker   . Smokeless tobacco: Never Used  .  Alcohol Use: 1 - 1.5 oz/week    2-3 drink(s) per week     Comment: once or twice a month  . Drug Use: No  . Sexual Activity: Yes    Birth Control/ Protection: None   Other Topics Concern  . Not on file   Social History Narrative   Pt lives at home with her spouse and daughter.   Caffeine Use- 6 to 8oz's daily     PHYSICAL EXAM  Filed Vitals:   01/11/13 0834 01/11/13 0835  BP: 109/76 98/66  Pulse: 90 99  Height: 5' 5.5" (1.664 m)   Weight: 237 lb 8 oz (107.729 kg)    Body mass index is 38.91 kg/(m^2).  GENERAL EXAM: Patient is in no  distress  CARDIOVASCULAR: Regular rate and rhythm, no murmurs, no carotid bruits  NEUROLOGIC: MENTAL STATUS: awake, alert, language fluent, comprehension intact, naming intact CRANIAL NERVE: no papilledema on fundoscopic exam, pupils equal and reactive to light, visual fields full to confrontation, extraocular muscles intact, no nystagmus, facial sensation and strength symmetric, uvula midline, shoulder shrug symmetric, tongue midline. MOTOR: MINIMAL POSTURAL TREMOR IN RUE > LUE. INCREASED TONE IN RUE AND RLE. BRADYKINESIA IN RUE AND RLE. Full strength in the BUE, BLE SENSORY: normal and symmetric to light touch, temperature, vibration COORDINATION: finger-nose-finger, fine finger movements normal REFLEXES: deep tendon reflexes present and symmetric GAIT/STATION: narrow based gait; DECR RIGHT ARM SWING. 25FT WALK (6s, 7s).    DIAGNOSTIC DATA (LABS, IMAGING, TESTING) - I reviewed patient records, labs, notes, testing and imaging myself where available.  Lab Results  Component Value Date   WBC 4.1 11/28/2012   HGB 12.2 11/28/2012   HCT 36.9 11/28/2012   MCV 93.2 11/28/2012   PLT 340 11/28/2012   No results found for this basename: na,  k,  cl,  co2,  glucose,  bun,  creatinine,  calcium,  prot,  albumin,  ast,  alt,  alkphos,  bilitot,  gfrnonaa,  gfraa   No results found for this basename: CHOL,  HDL,  LDLCALC,  LDLDIRECT,  TRIG,  CHOLHDL   No results found for this basename: HGBA1C   Lab Results  Component Value Date   VITAMINB12 910 03/30/2012   Lab Results  Component Value Date   TSH 2.169 03/30/2012   Lab Results  Component Value Date   CKTOTAL 74 06/21/2012   Aldolase  Date Value Range Status  06/21/2012 3.2  1.2 - 7.6 U/L Final   AChR Binding Ab, Serum  Date Value Range Status  06/21/2012 <0.03  0.00 - 0.24 nmol/L Final                                    Negative:   0.00 - 0.24                                    Borderline: 0.25 - 0.40                                     Positive:        > 0.40     I reviewed images myself and agree with interpretation.   04/14/12 MRI brain - normal   ASSESSMENT AND PLAN  47 y.o. year old female  has a past medical history of Anemia and Shortness of breath. here with constellation of symptoms including transient visual distortion, generalized fatigue, intermittent dizziness. Now with some extrapyramidal signs and symptoms on the right arm and right leg. Will check add'l testing and DATscan.  Ddx: parkinson's disease, parkinson's plus syndrome (MSA, CBGD), wilson's disease, dystonia syndrome  PLAN: Orders Placed This Encounter  Procedures  . MR Brain W Wo Contrast  . MR Cervical Spine W Wo Contrast  . Ceruloplasmin  . Copper, urine, 24 hour  . Copper, Serum  . Hepatic function panel  . AMB referral to nuclear medicine   Return in about 6 weeks (around 02/22/2013).   Suanne Marker, MD 01/11/2013, 9:11 AM Certified in Neurology, Neurophysiology and Neuroimaging  [facility] [address], [address] [place], [place] [address] [phone]

## 2013-01-11 NOTE — Patient Instructions (Signed)
I will check additional testing. 

## 2013-01-12 ENCOUNTER — Encounter: Payer: Self-pay | Admitting: Internal Medicine

## 2013-01-12 ENCOUNTER — Other Ambulatory Visit (INDEPENDENT_AMBULATORY_CARE_PROVIDER_SITE_OTHER): Payer: PRIVATE HEALTH INSURANCE

## 2013-01-12 ENCOUNTER — Ambulatory Visit (INDEPENDENT_AMBULATORY_CARE_PROVIDER_SITE_OTHER): Payer: PRIVATE HEALTH INSURANCE | Admitting: Internal Medicine

## 2013-01-12 ENCOUNTER — Institutional Professional Consult (permissible substitution): Payer: PRIVATE HEALTH INSURANCE | Admitting: Internal Medicine

## 2013-01-12 VITALS — BP 128/84 | HR 98 | Temp 98.8°F | Ht 63.5 in | Wt 239.2 lb

## 2013-01-12 DIAGNOSIS — R0600 Dyspnea, unspecified: Secondary | ICD-10-CM | POA: Insufficient documentation

## 2013-01-12 DIAGNOSIS — R0609 Other forms of dyspnea: Secondary | ICD-10-CM

## 2013-01-12 LAB — HEPATIC FUNCTION PANEL
ALT: 9 IU/L (ref 0–32)
AST: 17 IU/L (ref 0–40)
Alkaline Phosphatase: 70 IU/L (ref 39–117)
Total Protein: 6.7 g/dL (ref 6.0–8.5)

## 2013-01-12 LAB — CERULOPLASMIN: Ceruloplasmin: 27.9 mg/dL (ref 16.0–45.0)

## 2013-01-12 LAB — CBC WITH DIFFERENTIAL/PLATELET
Basophils Relative: 0.4 % (ref 0.0–3.0)
Eosinophils Relative: 0.8 % (ref 0.0–5.0)
HCT: 37.2 % (ref 36.0–46.0)
Hemoglobin: 12.6 g/dL (ref 12.0–15.0)
Lymphs Abs: 1.2 10*3/uL (ref 0.7–4.0)
MCV: 93.3 fl (ref 78.0–100.0)
Monocytes Absolute: 0.4 10*3/uL (ref 0.1–1.0)
Monocytes Relative: 10.1 % (ref 3.0–12.0)
Neutro Abs: 2.4 10*3/uL (ref 1.4–7.7)
RBC: 3.99 Mil/uL (ref 3.87–5.11)
WBC: 4.1 10*3/uL — ABNORMAL LOW (ref 4.5–10.5)

## 2013-01-12 LAB — BASIC METABOLIC PANEL
GFR: 109.08 mL/min (ref >60.00)
Potassium: 4.4 mEq/L (ref 3.5–5.1)
Sodium: 136 mEq/L (ref 135–145)

## 2013-01-12 MED ORDER — PANTOPRAZOLE SODIUM 40 MG PO TBEC: 40.0000 mg | DELAYED_RELEASE_TABLET | Freq: Every day | ORAL | Status: DC

## 2013-01-12 MED ORDER — FAMOTIDINE 20 MG PO TABS: ORAL_TABLET | ORAL | Status: DC

## 2013-01-12 NOTE — Patient Instructions (Addendum)
Pantoprazole (protonix) 40 mg   Take 30-60 min before first meal of the day and Pepcid 20 mg one bedtime until return to office - this is the best way to tell whether stomach acid is contributing to your problem.   GERD (REFLUX)  is an extremely common cause of respiratory symptoms, many times with no significant heartburn at all.    It can be treated with medication, but also with lifestyle changes including avoidance of late meals, excessive alcohol, smoking cessation, and avoid fatty foods, chocolate, peppermint, colas, red wine, and acidic juices such as orange juice.  NO MINT OR MENTHOL PRODUCTS SO NO COUGH DROPS  USE SUGARLESS CANDY INSTEAD (jolley ranchers or Stover's)  NO OIL BASED VITAMINS - use powdered substitutes.   Please remember to go to the lab   department downstairs for your tests - we will call you with the results when they are available.    Please schedule a follow up office visit in 4 weeks, sooner if needed with pfts

## 2013-01-12 NOTE — Progress Notes (Deleted)
  Subjective:    Patient ID: Kristin Gates, female    DOB: 29-Oct-1965, 47 y.o.   MRN: [id number]  HPI    Review of Systems  Constitutional: Negative for fever, chills and unexpected weight change.  HENT: Negative for congestion, dental problem, ear pain, nosebleeds, postnasal drip, rhinorrhea, sinus pressure, sneezing, sore throat, trouble swallowing and voice change.   Eyes: Negative for visual disturbance.  Respiratory: Positive for cough and shortness of breath. Negative for choking.   Cardiovascular: Negative for chest pain and leg swelling.  Gastrointestinal: Negative for vomiting, abdominal pain and diarrhea.  Genitourinary: Negative for difficulty urinating.  Musculoskeletal: Negative for arthralgias.  Skin: Negative for rash.  Neurological: Negative for tremors, syncope and headaches.  Hematological: Does not bruise/bleed easily.       Objective:   Physical Exam        Assessment & Plan:

## 2013-01-12 NOTE — Progress Notes (Signed)
  Subjective:    Patient ID: Kristin Gates, female    DOB: 08/20/1965   MRN: [id number]  HPI  39 yobf never smoked never respiratory problems until late Spring 2014 doe referred  to pulmonary clinic 01/12/2013  Chief Complaint  Patient presents with  . Pulmonary Consult    Referred by Dr. Velna Hatchet. Pt states having SOB for the past 6-9 months. She gets SOB with walking up stairs and on flat surface about 100 ft. She sometimes feels like she is unable to take in a good deep breath while she is sitting.       01/12/2013 1st [facility] office visit/ Korina Tretter cc indolent onset variable doe with steps or inclines assoc with weakness on R hand and R leg.  Not coughing at all. Could not do treadmill due to weakness on  R still undergoing neuro eval for this.  No obvious day to day or daytime variabilty or cp or chest tightness, subjective wheeze overt sinus or hb symptoms. No unusual exp hx or h/o childhood pna/ asthma or knowledge of premature birth.  Sleeping ok without nocturnal  or early am exacerbation  of respiratory  c/o's or need for noct saba. Also denies any obvious fluctuation of symptoms with weather or environmental changes or other aggravating or alleviating factors except as outlined above   Current Medications, Allergies, Complete Past Medical History, Past Surgical History, Family History, and Social History were reviewed in [employer] record.         Review of Systems  Constitutional: Negative for fever, chills and unexpected weight change.  HENT: Negative for congestion, dental problem, ear pain, nosebleeds, postnasal drip, rhinorrhea, sinus pressure, sneezing, sore throat, trouble swallowing and voice change.   Eyes: Negative for visual disturbance.  Respiratory: Positive for shortness of breath. Negative for cough and choking.   Cardiovascular: Negative for chest pain and leg swelling.  Gastrointestinal: Negative for vomiting, abdominal  pain and diarrhea.  Genitourinary: Negative for difficulty urinating.  Musculoskeletal: Positive for arthralgias.  Skin: Negative for rash.  Neurological: Negative for tremors, syncope and headaches.  Hematological: Does not bruise/bleed easily.       Objective:   Physical Exam  Wt Readings from Last 3 Encounters:  01/12/13 239 lb 3.2 oz (108.5 kg)  01/11/13 237 lb 8 oz (107.729 kg)  11/28/12 244 lb (110.678 kg)     amb bf nad with prominent pseudowheeze   HEENT: nl dentition, turbinates, and orophanx. Nl external ear canals without cough reflex   NECK :  without JVD/Nodes/TM/ nl carotid upstrokes bilaterally   LUNGS: no acc muscle use, clear to A and P bilaterally without cough on insp or exp maneuvers   CV:  RRR  no s3 or murmur or increase in P2, no edema   ABD:  Obese soft and nontender with nl excursion in the supine position. No bruits or organomegaly, bowel sounds nl  MS:  warm without deformities, calf tenderness, cyanosis or clubbing  SKIN: warm and dry without lesions    NEURO:  alert, approp, no deficits    08/10/12  No active lung disease  01/12/13 Labs Nl d dimer, nl cbc, bmet    Assessment & Plan:

## 2013-01-13 LAB — D-DIMER, QUANTITATIVE: D-Dimer, Quant: 0.3 ug/mL-FEU (ref 0.00–0.48)

## 2013-01-13 NOTE — Progress Notes (Signed)
Quick Note:  Spoke with pt and notified of results per Dr. Wert. Pt verbalized understanding and denied any questions.  ______ 

## 2013-01-14 NOTE — Assessment & Plan Note (Signed)
-   01/12/2013  Walked RA x 3 laps @ 185 ft each stopped due to  End of study, sats 87% at the very end s sign sob  Symptoms are disproportionate to objective findings and not clear this is a lung problem but pt does appear to have difficult airway management issues and it is unusual to see true desat with exercise in pts with obesity unless there is atx in bases related to restrictive changes.  Therefore needs pfts and in meantime ? Acid (or non-acid) GERD > always difficult to exclude as up to 75% of pts in some series report no assoc GI/ Heartburn symptoms> rec max (24h)  acid suppression and diet restrictions/ reviewed and instructions given in writting   Will regroup in 4 weeks with pfts

## 2013-01-20 DIAGNOSIS — R251 Tremor, unspecified: Secondary | ICD-10-CM | POA: Insufficient documentation

## 2013-01-24 ENCOUNTER — Ambulatory Visit (INDEPENDENT_AMBULATORY_CARE_PROVIDER_SITE_OTHER): Payer: PRIVATE HEALTH INSURANCE | Admitting: Emergency Medicine

## 2013-01-24 VITALS — BP 108/78 | HR 98 | Temp 99.0°F | Resp 20 | Ht 65.5 in | Wt 229.4 lb

## 2013-01-24 DIAGNOSIS — R42 Dizziness and giddiness: Secondary | ICD-10-CM

## 2013-01-24 LAB — POCT CBC
Granulocyte percent: 60.2 %G (ref 37–80)
HCT, POC: 38.9 % (ref 37.7–47.9)
MCHC: 31.4 g/dL — AB (ref 31.8–35.4)
MCV: 98.4 fL — AB (ref 80–97)
MID (cbc): 0.2 (ref 0–0.9)
MPV: 8.7 fL (ref 0–99.8)
POC Granulocyte: 2.2 (ref 2–6.9)
POC LYMPH PERCENT: 33.8 %L (ref 10–50)
Platelet Count, POC: 386 10*3/uL (ref 142–424)
RDW, POC: 14.2 %

## 2013-01-24 LAB — COMPREHENSIVE METABOLIC PANEL
AST: 17 U/L (ref 0–37)
Albumin: 4.1 g/dL (ref 3.5–5.2)
Alkaline Phosphatase: 58 U/L (ref 39–117)
BUN: 13 mg/dL (ref 6–23)
CO2: 27 mEq/L (ref 19–32)
Creat: 0.81 mg/dL (ref 0.50–1.10)
Glucose, Bld: 93 mg/dL (ref 70–99)
Potassium: 4.9 mEq/L (ref 3.5–5.3)
Total Bilirubin: 0.6 mg/dL (ref 0.3–1.2)
Total Protein: 7.1 g/dL (ref 6.0–8.3)

## 2013-01-24 NOTE — Progress Notes (Addendum)
[facility] [address], [place] [address] [phone]  Date:  01/24/2013   Name:  Kristin Gates   DOB:  1965-07-07   MRN:  [id number]  PCP:  Gwynneth Aliment, MD    Chief Complaint: Dizziness   History of Present Illness:  Kristin Gates is a 47 y.o. very pleasant female patient who presents with the following:  Complex history.  She is under evaluation, scheduled for MRA brain and neck scheduled for tomorrow, for an as of yet undetermined CNS condition.  Under consideration is Wilsons, parkinsons.  The investigation was initiated following some lightheadedness last winter. She had an episode of dizziness this morning while in the shower that persisted until she was dressed and interfered with her dressing.  Had difficulty with balance and walking until the episode passed.  No headache, numbness, tingling or weakness, facial asymmetry, worsened expressive difficulty, abnormality of speech or facial asymmetry.  No chest pain, shortness of breath, nausea or vomiting, stool change.  Now she feels she is back to her baseline.  Today's episode is much worse than any prior episodes.  Denies other complaint or health concern today.   Patient Active Problem List   Diagnosis Date Noted  . Tremor 01/20/2013  . Dyspnea 01/12/2013    Past Medical History  Diagnosis Date  . Anemia   . Shortness of breath     from anemia    Past Surgical History  Procedure Laterality Date  . Cesarean section    . Tubal ligation    . Dilitation & currettage/hystroscopy with versapoint resection N/A 12/02/2012    Procedure: DILATATION & CURETTAGE/HYSTEROSCOPY WITH VERSAPOINT RESECTION;  Surgeon: Genia Del, MD;  Location: [facility];  Service: Gynecology;  Laterality: N/A;    History  Substance Use Topics  . Smoking status: Never Smoker   . Smokeless tobacco: Never Used  . Alcohol Use: 1 - 1.5 oz/week    2-3 drink(s) per week     Comment: once or twice a month     Family History  Problem Relation Age of Onset  . Cancer Mother     neuro-endocrine syndrome  . Hypertension Mother   . Heart disease Father   . Hypertension Father   . Diabetes Father   . Heart failure Father   . Asthma Sister     No Known Allergies  Medication list has been reviewed and updated.  Current Outpatient Prescriptions on File Prior to Visit  Medication Sig Dispense Refill  . famotidine (PEPCID) 20 MG tablet One at bedtime  30 tablet  2  . pantoprazole (PROTONIX) 40 MG tablet Take 1 tablet (40 mg total) by mouth daily. Take 30-60 min before first meal of the day  30 tablet  2   No current facility-administered medications on file prior to visit.    Review of Systems:  As per HPI, otherwise negative.    Physical Examination: Filed Vitals:   01/24/13 0937  BP: 108/78  Pulse: 98  Temp: 99 F (37.2 C)  Resp: 20   Filed Vitals:   01/24/13 0937  Height: 5' 5.5" (1.664 m)  Weight: 229 lb 6.4 oz (104.055 kg)   Body mass index is 37.58 kg/(m^2). Ideal Body Weight: Weight in (lb) to have BMI = 25: 152.2  GEN: WDWN, NAD, Non-toxic, A & O x 3 HEENT: Atraumatic, Normocephalic. Neck supple. No masses, No LAD. Ears and Nose: No external deformity. CV: RRR, No M/G/R. No JVD. No thrill.  No extra heart sounds. PULM: CTA B, no wheezes, crackles, rhonchi. No retractions. No resp. distress. No accessory muscle use. ABD: S, NT, ND, +BS. No rebound. No HSM. EXTR: No c/c/e NEURO Normal gait. Slow and deliberate speech and movements.  PRRERLA EOMI CN2-12 intact.  Romberg intact.  Some weakness in RUE flexion. PSYCH: Normally interactive. Conversant. Not depressed or anxious appearing.  Calm demeanor.    Assessment and Plan: Dizziness Continue neuro workup planned Repeat labs Advised not to drive  Signed,  Phillips Odor, MD   Results for orders placed in visit on 01/24/13  POCT CBC      Result Value Range   WBC 3.7 (*) 4.6 - 10.2 K/uL   Lymph, poc 1.3   0.6 - 3.4   POC LYMPH PERCENT 33.8  10 - 50 %L   MID (cbc) 0.2  0 - 0.9   POC MID % 6.0  0 - 12 %M   POC Granulocyte 2.2  2 - 6.9   Granulocyte percent 60.2  37 - 80 %G   RBC 3.95 (*) 4.04 - 5.48 M/uL   Hemoglobin 12.2  12.2 - 16.2 g/dL   HCT, POC 16.1  09.6 - 47.9 %   MCV 98.4 (*) 80 - 97 fL   MCH, POC 30.9  27 - 31.2 pg   MCHC 31.4 (*) 31.8 - 35.4 g/dL   RDW, POC 04.5     Platelet Count, POC 386  142 - 424 K/uL   MPV 8.7  0 - 99.8 fL

## 2013-01-25 ENCOUNTER — Ambulatory Visit (INDEPENDENT_AMBULATORY_CARE_PROVIDER_SITE_OTHER): Payer: PRIVATE HEALTH INSURANCE

## 2013-01-25 DIAGNOSIS — R259 Unspecified abnormal involuntary movements: Secondary | ICD-10-CM

## 2013-01-25 DIAGNOSIS — R251 Tremor, unspecified: Secondary | ICD-10-CM

## 2013-01-25 DIAGNOSIS — R29898 Other symptoms and signs involving the musculoskeletal system: Secondary | ICD-10-CM

## 2013-01-25 DIAGNOSIS — R258 Other abnormal involuntary movements: Secondary | ICD-10-CM

## 2013-01-25 DIAGNOSIS — R6889 Other general symptoms and signs: Secondary | ICD-10-CM

## 2013-01-25 MED ORDER — GADOPENTETATE DIMEGLUMINE 469.01 MG/ML IV SOLN: 20.0000 mL | Freq: Once | INTRAVENOUS | Status: AC | PRN

## 2013-01-27 ENCOUNTER — Telehealth: Payer: Self-pay | Admitting: Diagnostic Neuroimaging

## 2013-01-31 NOTE — Telephone Encounter (Signed)
Please advise patient that her brain MRI was reported as normal. Her neck MRI shows mild degenerative changes. Her nuclear medicine scan at [facility] does suggest the possibility that she may have underlying Parkinson's disease or Parkinson's-like disease. As he has discussed with her during her appointment last time, there is multiple possibilities his first diagnosis. He will have to follow her clinically. Please advise her that this test is not diagnostic of Parkinson's disease and always needs to be interpreted in the clinical context. Dr. Marjory Lies will go over details during her upcoming followup appointment.

## 2013-01-31 NOTE — Telephone Encounter (Signed)
Patient requesting MRI and DAT scan results. Patient had DAT scan completed at [facility]. Advised not showing DAT scan info in system as of yet. Will search for results and fwd to [facility]. Patient agreed.

## 2013-02-01 NOTE — Telephone Encounter (Signed)
Called patient concerning her MRI of the brain being normal. Her neck MRI showed mild degenerative changes and her nuclear medicine scan at [facility] does suggest the possibility that she may have underlying Parkinson's disease or Parkinson's like disease, per Dr. Frances Furbish. Patient has an upcoming appt. On 02/28/13. Patient stated that she can not wait that long to see the doctor, patient wants to come in earlier. Please advise.

## 2013-02-07 ENCOUNTER — Telehealth: Payer: Self-pay | Admitting: Diagnostic Neuroimaging

## 2013-02-07 NOTE — Telephone Encounter (Signed)
Please advise 

## 2013-02-08 ENCOUNTER — Telehealth: Payer: Self-pay | Admitting: Diagnostic Neuroimaging

## 2013-02-09 NOTE — Telephone Encounter (Signed)
I spoke to pt and let her know that I would check into the results of datscan and then call her back.   After receiving the results from [facility] / nuclear medicine, I was rereading notes and do see note where pt was called with results of MRI head, neck and the datscan.  Question of earlier appt.  I was able to relay the results as per Dr. Nelda Bucks already stated to her on 02-01-13.  Made earlier appt tomorrow at 0900 with Dr. Marjory Lies to go over results.

## 2013-02-10 ENCOUNTER — Encounter: Payer: Self-pay | Admitting: Internal Medicine

## 2013-02-10 ENCOUNTER — Ambulatory Visit (INDEPENDENT_AMBULATORY_CARE_PROVIDER_SITE_OTHER): Payer: BC Managed Care – PPO | Admitting: Internal Medicine

## 2013-02-10 ENCOUNTER — Ambulatory Visit (INDEPENDENT_AMBULATORY_CARE_PROVIDER_SITE_OTHER): Payer: BC Managed Care – PPO | Admitting: Diagnostic Neuroimaging

## 2013-02-10 ENCOUNTER — Encounter: Payer: Self-pay | Admitting: Diagnostic Neuroimaging

## 2013-02-10 ENCOUNTER — Other Ambulatory Visit (INDEPENDENT_AMBULATORY_CARE_PROVIDER_SITE_OTHER): Payer: BC Managed Care – PPO

## 2013-02-10 VITALS — BP 98/56 | HR 59 | Ht 65.5 in | Wt 230.0 lb

## 2013-02-10 VITALS — BP 120/70 | HR 95 | Temp 98.7°F | Ht 65.5 in | Wt 227.0 lb

## 2013-02-10 DIAGNOSIS — R0609 Other forms of dyspnea: Secondary | ICD-10-CM

## 2013-02-10 DIAGNOSIS — R29898 Other symptoms and signs involving the musculoskeletal system: Secondary | ICD-10-CM | POA: Insufficient documentation

## 2013-02-10 DIAGNOSIS — R258 Other abnormal involuntary movements: Secondary | ICD-10-CM | POA: Insufficient documentation

## 2013-02-10 DIAGNOSIS — R0600 Dyspnea, unspecified: Secondary | ICD-10-CM

## 2013-02-10 DIAGNOSIS — R6889 Other general symptoms and signs: Secondary | ICD-10-CM

## 2013-02-10 DIAGNOSIS — I951 Orthostatic hypotension: Secondary | ICD-10-CM

## 2013-02-10 DIAGNOSIS — R259 Unspecified abnormal involuntary movements: Secondary | ICD-10-CM

## 2013-02-10 DIAGNOSIS — R42 Dizziness and giddiness: Secondary | ICD-10-CM

## 2013-02-10 LAB — BASIC METABOLIC PANEL
BUN: 14 mg/dL (ref 6–23)
CO2: 26 mEq/L (ref 19–32)
Chloride: 101 mEq/L (ref 96–112)
Creatinine, Ser: 0.7 mg/dL (ref 0.4–1.2)
GFR: 116.26 mL/min (ref >60.00)
Glucose, Bld: 86 mg/dL (ref 70–99)

## 2013-02-10 LAB — HEPATIC FUNCTION PANEL
ALT: 12 U/L (ref 0–35)
Albumin: 4 g/dL (ref 3.5–5.2)
Total Bilirubin: 0.9 mg/dL (ref 0.3–1.2)

## 2013-02-10 LAB — CBC WITH DIFFERENTIAL/PLATELET
Basophils Absolute: 0 10*3/uL (ref 0.0–0.1)
Basophils Relative: 0.2 % (ref 0.0–3.0)
Eosinophils Relative: 0.5 % (ref 0.0–5.0)
HCT: 35.7 % — ABNORMAL LOW (ref 36.0–46.0)
Hemoglobin: 12 g/dL (ref 12.0–15.0)
Lymphs Abs: 1.1 10*3/uL (ref 0.7–4.0)
MCHC: 33.6 g/dL (ref 30.0–36.0)
MCV: 92.6 fl (ref 78.0–100.0)
Monocytes Absolute: 0.4 10*3/uL (ref 0.1–1.0)
Neutro Abs: 2.2 10*3/uL (ref 1.4–7.7)
Platelets: 389 10*3/uL (ref 150.0–400.0)
RBC: 3.86 Mil/uL — ABNORMAL LOW (ref 3.87–5.11)
RDW: 13.7 % (ref 11.5–14.6)

## 2013-02-10 LAB — TSH: TSH: 0.74 u[IU]/mL (ref 0.35–5.50)

## 2013-02-10 LAB — PULMONARY FUNCTION TEST

## 2013-02-10 MED ORDER — CARBIDOPA-LEVODOPA 25-100 MG PO TABS: 1.0000 | ORAL_TABLET | Freq: Three times a day (TID) | ORAL | Status: DC

## 2013-02-10 NOTE — Progress Notes (Signed)
Subjective:    Patient ID: Kristin Gates, female    DOB: 12-Oct-1965   MRN: [id number]   Brief patient profile:  47 yobf never smoked never respiratory problems until late Spring 2014 doe referred  to pulmonary clinic 01/12/2013 by Dr Velna Hatchet for sob at rest and walking 149ft    History of Present Illness  01/12/2013 [facility]st [facility] office visit/ Kristin Gates cc indolent onset variable doe with steps or inclines assoc with weakness on R hand and R leg.  Not coughing at all. Could not do treadmill due to weakness on  R still undergoing neuro eval for this. rec Pantoprazole (protonix) 40 mg   Take 30-60 min before first meal of the day and Pepcid 20 mg one bedtime until return to office - this is the best way to tell whether stomach acid is contributing to your problem.  GERD diet   02/10/2013 f/u ov/Kristin Gates re: unexplained sob ? Upper airway dysfunction. Chief Complaint  Patient presents with  . Follow-up    PFT done today.  Breathing is unchanged since last OV.  Since last ov no change in urge to clear throat  Did not use ppi/h2 hs as rec consistently  Neurology concerned re autonomic dysfunction with orthostasis s tachycardia Very poor appetite, not even drinking much "don't feel like it" - at ov late pm had not had anything at all po all day.  No obvious day to day or daytime variabilty or assoc chronic cough or cp or chest tightness, subjective wheeze overt sinus or hb symptoms. No unusual exp hx or h/o childhood pna/ asthma or knowledge of premature birth.  Sleeping ok without nocturnal  or early am exacerbation  of respiratory  c/o's or need for noct saba. Also denies any obvious fluctuation of symptoms with weather or environmental changes or other aggravating or alleviating factors except as outlined above   Current Medications, Allergies, Complete Past Medical History, Past Surgical History, Family History, and Social History were reviewed in [employer]  record.  ROS  The following are not active complaints unless bolded sore throat, dysphagia, dental problems, itching, sneezing,  nasal congestion or excess/ purulent secretions, ear ache,   fever, chills, sweats, unintended wt loss, pleuritic or exertional cp, hemoptysis,  orthopnea pnd or leg swelling, presyncope, palpitations, heartburn, abdominal pain, anorexia, nausea, vomiting, diarrhea  or change in bowel or urinary habits, change in stools or urine, dysuria,hematuria,  rash, arthralgias, visual complaints, headache, numbness weakness or ataxia or problems with walking or coordination,  change in mood/affect or memory.              Objective:   Physical Exam   02/10/2013       227  Wt Readings from Last 3 Encounters:  01/12/13 239 lb 3.2 oz (108.5 kg)  01/11/13 237 lb 8 oz (107.729 kg)  11/28/12 244 lb (110.678 kg)     amb bf nad with no longer any prominent pseudowheezes but affect very unusual ? Belle indifference features when describing appetite issues  HEENT: nl dentition, turbinates, and orophanx. Nl external ear canals without cough reflex   NECK :  without JVD/Nodes/TM/ nl carotid upstrokes bilaterally   LUNGS: no acc muscle use, clear to A and P bilaterally without cough on insp or exp maneuvers   CV:  RRR  no s3 or murmur or increase in P2, no edema   ABD:  Obese soft and nontender with nl excursion in the supine position. No bruits  or organomegaly, bowel sounds nl  MS:  warm without deformities, calf tenderness, cyanosis or clubbing  SKIN: warm and dry without lesions    NEURO:  alert, approp, no deficits    08/10/12  No active lung disease  01/12/13 Labs Nl d dimer, nl cbc, bmet s evidence dehydration    Assessment & Plan:

## 2013-02-10 NOTE — Patient Instructions (Signed)
Start carbidopa-levodopa 25-100, half tablet 3 times a day before meals. After 2 weeks increase to one full tablet 3 times per day.  Please purchase a blood pressure machine at local pharmacy. Check blood pressure readings twice a day in a log book. Also check pressures when he was feeling dizzy.  Increase your fluid intake to avoid low blood pressure and dizziness.  Use caution when moving from laying down or seated position to standing. Avoid rapid changes in position.

## 2013-02-10 NOTE — Progress Notes (Signed)
PFT done today. 

## 2013-02-10 NOTE — Progress Notes (Signed)
[facility]  PATIENT: Kristin Gates DOB: [birth year]  REFERRING CLINICIAN:  HISTORY FROM: patient and sister REASON FOR VISIT: follow up   HISTORICAL  CHIEF COMPLAINT:  Chief Complaint  Patient presents with  . Follow-up    tremor, dizziness, black out    HISTORY OF PRESENT ILLNESS:   UPDATE 02/10/13: Since last visit patient having more problems with dizziness and orthostatic lightheadedness. A few days before her DATscan on 01/26/13, patient had significant lightheadedness while standing up and almost passed out. She is getting dressed. She had intermittent episodes of presyncope accompanied by blurred vision, seeing black spots, and losing hearing. Patient has had poor by mouth intake of fluids and eating, because she has lost her appetite. Her sister has noted that patient seems to avoid drinking fluids because she has frequent when she drinks water. Sister has also noticed change in patient's gait, with intermittent freezing and hesitation.  UPDATE 01/11/13: Since last visit, right arm tremor, incoordination, and stiffness have worsened. Right leg feels stiff and heavy. Her right arm and leg "don't do what I want them to do" in her words. Fatigue, dizziness and generalized weakness have continued as well. She is scheduled to see pulmonary and have sleep study soon.  PRIOR HPI (06/21/12): 46 year old right-handed female with iron deficiency anemia, here for evaluation of lightheadedness, muscle weakness, tremor, fatigue. Symptoms have been present for approximately one year. She is noted change in her handwriting, fine finger movements, generalized muscle weakness with walking and getting up and down from her chair. She also has intermittent lightheadedness when she stands up. She denies decreased by mouth intake with dehydration.  In January or February 2014, patient had a single episode of visual distortion, while talking with a Radio broadcast assistant. It looked as though  she was looking through water running down the piece of glass. His lasted for a few minutes. She had headache during the day. No nausea, vomiting, photophobia, phonophobia or dizziness.  Patient has had MRI of the brain which was normal. TSH, B12 also normal.  REVIEW OF SYSTEMS: Full 14 system review of systems performed and notable only for fatigue hearing loss ringing in ears blurred vision shortness of breath snoring anemia feeling hot and cold cramps aching muscles sleepiness snoring weakness dizziness tremor depression anxiety decreased energy change in appetite disinterest in activities.   ALLERGIES: Not on File  HOME MEDICATIONS: Outpatient Prescriptions Prior to Visit  Medication Sig Dispense Refill  . famotidine (PEPCID) 20 MG tablet One at bedtime  30 tablet  2  . pantoprazole (PROTONIX) 40 MG tablet Take 1 tablet (40 mg total) by mouth daily. Take 30-60 min before first meal of the day  30 tablet  2   No facility-administered medications prior to visit.    PAST MEDICAL HISTORY: Past Medical History  Diagnosis Date  . Anemia   . Shortness of breath     from anemia    PAST SURGICAL HISTORY: Past Surgical History  Procedure Laterality Date  . Cesarean section    . Tubal ligation    . Dilitation & currettage/hystroscopy with versapoint resection N/A 12/02/2012    Procedure: DILATATION & CURETTAGE/HYSTEROSCOPY WITH VERSAPOINT RESECTION;  Surgeon: Genia Del, MD;  Location: [facility];  Service: Gynecology;  Laterality: N/A;    FAMILY HISTORY: Family History  Problem Relation Age of Onset  . Cancer Mother     neuro-endocrine syndrome  . Hypertension Mother   . Heart disease Father   . Hypertension Father   .  Diabetes Father   . Heart failure Father   . Asthma Sister     SOCIAL HISTORY:  History   Social History  . Marital Status: Married    Spouse Name: Dimas Aguas    Number of Children: 1  . Years of Education: college    Occupational History  .  collector     [employer]  .     Social History Main Topics  . Smoking status: Never Smoker   . Smokeless tobacco: Never Used  . Alcohol Use: 1 - 1.5 oz/week    2-3 drink(s) per week     Comment: once or twice a month  . Drug Use: No  . Sexual Activity: Yes    Birth Control/ Protection: None   Other Topics Concern  . Not on file   Social History Narrative   Pt lives at home with her spouse and daughter.   Caffeine Use- 6 to 8oz's daily     PHYSICAL EXAM  Filed Vitals:   02/10/13 0937 02/10/13 0948 02/10/13 0950  BP:  123/79 98/56  Pulse:  86 59  Height: 5' 5.5" (1.664 m)    Weight: 230 lb (104.327 kg)          SUPINE   STANDING   Body mass index is 37.68 kg/(m^2).  GENERAL EXAM: Patient is in no distress; well developed, nourished and groomed; MASKED FACIES. NEG SNOUT. BORDERLINE MYERSON'S.  CARDIOVASCULAR: Regular rate and rhythm, no murmurs, no carotid bruits  NEUROLOGIC: MENTAL STATUS: awake, alert, oriented to person, place and time, normal attention and concentration, language fluent, comprehension intact, naming intact, fund of knowledge appropriate CRANIAL NERVE: no papilledema on fundoscopic exam, pupils equal and reactive to light, visual fields full to confrontation, extraocular muscles intact, no nystagmus, facial sensation and strength symmetric, hearing intact, palate elevates symmetrically, uvula midline, shoulder shrug symmetric, tongue midline. MOTOR: MINIMAL POSTURAL TREMOR IN RUE > LUE. INCREASED TONE IN RUE AND RLE. BRADYKINESIA IN RUE AND RLE. Full strength in the BUE, BLE SENSORY: normal and symmetric to light touch, temperature, vibration COORDINATION: finger-nose-finger, fine finger movements normal REFLEXES: deep tendon reflexes present and symmetric GAIT/STATION: narrow based gait; DECR ARM SWING.    DIAGNOSTIC DATA (LABS, IMAGING, TESTING) - I reviewed patient records, labs, notes, testing and imaging myself where available.  Lab  Results  Component Value Date   WBC 3.7* 01/24/2013   HGB 12.2 01/24/2013   HCT 38.9 01/24/2013   MCV 98.4* 01/24/2013   PLT 422.0* 01/12/2013      Component Value Date/Time   NA 138 01/24/2013 1016   No results found for this basename: CHOL,  HDL,  LDLCALC,  LDLDIRECT,  TRIG,  CHOLHDL   No results found for this basename: HGBA1C   Lab Results  Component Value Date   VITAMINB12 910 03/30/2012   Lab Results  Component Value Date   TSH 2.169 03/30/2012   Lab Results  Component Value Date   CKTOTAL 74 06/21/2012   Aldolase  Date Value Range Status  06/21/2012 3.2  1.2 - 7.6 U/L Final   AChR Binding Ab, Serum  Date Value Range Status  06/21/2012 <0.03  0.00 - 0.24 nmol/L Final                                    Negative:   0.00 - 0.24  Borderline: 0.25 - 0.40                                    Positive:        > 0.40   01/11/13 SERUM COPPER - 123 (72-166 ug/dL)  Ceruloplasmin  Date Value Range Status  01/11/2013 27.9  16.0 - 45.0 mg/dL Final   Lab Results  Component Value Date   ALT 9 01/24/2013   AST 17 01/24/2013   ALKPHOS 58 01/24/2013   BILITOT 0.6 01/24/2013    04/14/12 MRI brain - normal  01/26/13 MRI brain - normal; I reviewed images myself and agree with interpretation.   01/26/13 MRI cervical - mild spondylitic changes at C4-5 and C5-6 but without significant compression. No enhancing lesions are noted; I reviewed images myself and agree with interpretation.  01/26/13 DAT scan  - virtually absent uptake bilaterally affecting both putamen and caudate nuclear. This pattern can be seen in Parkinson's disease or related symptoms.   ASSESSMENT AND PLAN  47 y.o. year old female  has a past medical history of Anemia and Shortness of breath. here with constellation of symptoms including transient visual distortion, generalized fatigue, intermittent dizziness. Also with some extrapyramidal signs and symptoms on the right arm and  right leg.   Also with orthostatic hypotension on vitals with symptomatic lightheadedness today. Filed Vitals:   02/10/13 0948 02/10/13 0950  BP: 123/79 98/56  Pulse: 86 59  Height:    Weight:      SUPINE   STANDING  Ddx: parkinson's disease vs parkinson's plus syndrome (MSA or CBD)  PLAN: - trial of carbidopa-levodopa - increase fluid intake; monitor vitals at home. may need to consider midodrine or florinef if orthostatic hypotension / autonomic insufficiency continues   Return in about 3 months (around 05/11/2013).   Suanne Marker, MD 02/10/2013, 10:51 AM Certified in Neurology, Neurophysiology and Neuroimaging  [facility] [address], [address] [place], [place] [address] [phone]

## 2013-02-10 NOTE — Patient Instructions (Addendum)
Push fluids, gatorade/ broth based soups  Please see patient coordinator before you leave today  to schedule to be done 02/13/13 after hydrate over the weekend  Please remember to go to  LAB department downstairs for your tests - we will call you with the results when they are available.  No pulmonary f/u is needed unless indicated by the tests pending

## 2013-02-12 NOTE — Assessment & Plan Note (Addendum)
-   01/12/2013  Walked RA x 3 laps @ 185 ft each stopped due to  End of study, sats 87% at the very end s sign sob - 02/10/2013 PFT's wnl - 02/10/2013  Walked RA x 3 laps @ 185 ft each stopped due to desat to 84% with pulse 117   Not clear at all why has desats with ex based on cxr and pfts so needs cta to complete the w/u.   Note she does have nl heart rate response to ex and nl ekg

## 2013-02-12 NOTE — Assessment & Plan Note (Signed)
Poor po intake ? Why > needs to hydrate before CTa  Neuro w/u in progress for ? Parkinson's dz

## 2013-02-13 ENCOUNTER — Ambulatory Visit (INDEPENDENT_AMBULATORY_CARE_PROVIDER_SITE_OTHER)
Admission: RE | Admit: 2013-02-13 | Discharge: 2013-02-13 | Disposition: A | Discharge status: Home / Self Care | Payer: BC Managed Care – PPO | Source: Ambulatory Visit | Attending: Internal Medicine | Admitting: Internal Medicine

## 2013-02-13 ENCOUNTER — Encounter: Payer: Self-pay | Admitting: Internal Medicine

## 2013-02-13 DIAGNOSIS — R0600 Dyspnea, unspecified: Secondary | ICD-10-CM

## 2013-02-13 DIAGNOSIS — R0609 Other forms of dyspnea: Secondary | ICD-10-CM

## 2013-02-13 MED ORDER — IOHEXOL 350 MG/ML SOLN
80.0000 mL | Freq: Once | INTRAVENOUS | Status: AC | PRN
  Administered 2013-02-13: 80 mL via INTRAVENOUS

## 2013-02-13 NOTE — Progress Notes (Signed)
Quick Note:  Spoke with pt and notified of results per Dr. Wert. Pt verbalized understanding and denied any questions.  ______ 

## 2013-02-27 ENCOUNTER — Encounter: Payer: Self-pay | Admitting: Diagnostic Neuroimaging

## 2013-02-28 ENCOUNTER — Ambulatory Visit: Payer: PRIVATE HEALTH INSURANCE | Admitting: Diagnostic Neuroimaging

## 2013-03-07 ENCOUNTER — Encounter: Payer: Self-pay | Admitting: Internal Medicine

## 2013-03-28 ENCOUNTER — Other Ambulatory Visit: Payer: Self-pay | Admitting: Neurology

## 2013-03-28 DIAGNOSIS — G9389 Other specified disorders of brain: Secondary | ICD-10-CM

## 2013-03-31 ENCOUNTER — Ambulatory Visit (INDEPENDENT_AMBULATORY_CARE_PROVIDER_SITE_OTHER): Payer: BC Managed Care – PPO | Admitting: Physician Assistant

## 2013-03-31 VITALS — BP 95/64 | HR 105 | Temp 98.6°F | Resp 20 | Ht 65.5 in | Wt 229.0 lb

## 2013-03-31 DIAGNOSIS — J069 Acute upper respiratory infection, unspecified: Secondary | ICD-10-CM

## 2013-03-31 MED ORDER — IPRATROPIUM BROMIDE 0.03 % NA SOLN: 2.0000 | Freq: Two times a day (BID) | NASAL | Status: DC

## 2013-03-31 NOTE — Progress Notes (Signed)
   Subjective:    Patient ID: Kristin Gates, female    DOB: May 20, 1965, 48 y.o.   MRN: [id number]  HPI  Patient presents to clinic with nasal congestion, post nasal drip, ear pressure, body aches, sore throat, chills, intermittent cough with occasional phlegm, sinus pressure onset 03/29/13. Reports fatigue. Symptoms have been progressively worsening.  Several co-workers have recently been diagnosed with flu.  Denies headache, fever, nausea, vomiting, diarrhea, constipation, dysuria, changes in appetite SOB, lightheadedness and dizziness.Evelina Bucy Aleve yesterday for body aches which helped.    Review of Systems As above.     Objective:   Physical Exam  Vitals reviewed. Constitutional: She is oriented to person, place, and time. She appears well-developed and well-nourished.  HENT:  Head: Normocephalic and atraumatic.  Right Ear: Tympanic membrane, external ear and ear canal normal.  Left Ear: Tympanic membrane, external ear and ear canal normal.  Nose: Mucosal edema and rhinorrhea present.  Mouth/Throat: Uvula is midline, oropharynx is clear and moist and mucous membranes are normal. No oropharyngeal exudate, posterior oropharyngeal edema or posterior oropharyngeal erythema.  Eyes: Conjunctivae are normal.  Neck: Neck supple.  Cardiovascular: Normal rate, regular rhythm and normal heart sounds.   Pulmonary/Chest: Effort normal and breath sounds normal. She has no wheezes.  Lymphadenopathy:       Head (right side): No submental, no submandibular, no tonsillar, no preauricular, no posterior auricular and no occipital adenopathy present.       Head (left side): No submental, no submandibular, no tonsillar, no preauricular, no posterior auricular and no occipital adenopathy present.    She has no cervical adenopathy.       Right: No supraclavicular adenopathy present.       Left: No supraclavicular adenopathy present.  Neurological: She is alert and oriented to person, place, and time.    Skin: Skin is warm and dry.  Psychiatric: She has a normal mood and affect. Her behavior is normal. Judgment and thought content normal.         Assessment & Plan:   1. Viral URI Use Atrovent twice daily for nasal congestion. Use Mucinex daily to thin mucus. Drink plenty of fluids and rest. Symptoms should be improving over the next 3-4 days. If symptoms worsen, return to [facility].  - ipratropium (ATROVENT) 0.03 % nasal spray; Place 2 sprays into both nostrils 2 (two) times daily.  Dispense: 30 mL; Refill: 0

## 2013-03-31 NOTE — Patient Instructions (Signed)
Use atrovent twice daily for nasal congestion. Take Mucinex daily to thin mucus. Drink plenty of fluids and rest. If symptoms worsen over next few days, return to [facility].

## 2013-04-01 NOTE — Progress Notes (Signed)
I have examined this patient along with the student and agree.  

## 2013-04-05 ENCOUNTER — Telehealth: Payer: Self-pay | Admitting: *Deleted

## 2013-04-06 NOTE — Telephone Encounter (Signed)
Patient calling about getting an appointment on Monday per [facility]. Patient is also wanting Dr. Leta Baptist to send a referral over to [facility]. Please call the patient back.

## 2013-04-06 NOTE — Telephone Encounter (Signed)
Called patient and scheduled her an appt. For 04/10/13 at 9:30 am. I advised the patient the if she has any other problems, questions or concerns to call the office. Patient verbalized understanding.

## 2013-04-10 ENCOUNTER — Encounter: Payer: Self-pay | Admitting: Diagnostic Neuroimaging

## 2013-04-10 ENCOUNTER — Encounter (INDEPENDENT_AMBULATORY_CARE_PROVIDER_SITE_OTHER): Payer: Self-pay

## 2013-04-10 ENCOUNTER — Encounter: Payer: Self-pay | Admitting: *Deleted

## 2013-04-10 ENCOUNTER — Ambulatory Visit (INDEPENDENT_AMBULATORY_CARE_PROVIDER_SITE_OTHER): Payer: BC Managed Care – PPO | Admitting: Diagnostic Neuroimaging

## 2013-04-10 VITALS — BP 121/81 | HR 92 | Ht 65.5 in | Wt 230.0 lb

## 2013-04-10 DIAGNOSIS — G20 Parkinson's disease: Secondary | ICD-10-CM

## 2013-04-10 MED ORDER — CARBIDOPA-LEVODOPA 25-100 MG PO TABS: 1.5000 | ORAL_TABLET | Freq: Three times a day (TID) | ORAL | Status: DC

## 2013-04-10 MED ORDER — SERTRALINE HCL 25 MG PO TABS: 25.0000 mg | ORAL_TABLET | Freq: Every day | ORAL | Status: DC

## 2013-04-10 NOTE — Progress Notes (Signed)
[facility]  PATIENT: Kristin Gates DOB: 07-25-1965  REFERRING CLINICIAN:  HISTORY FROM: patient REASON FOR VISIT: follow up   HISTORICAL  CHIEF COMPLAINT:  Chief Complaint  Patient presents with  . Follow-up    tremor    HISTORY OF PRESENT ILLNESS:   UPDATE 46/2/15: Since last visit, carb/levo is helping with some stiffness and tremor control. No wearing off. Overall, patietn struggling more with her work. More anxiety, jittery feelings. Voice is getting soft and hoarse. Poor sleep. More orthostatic lightheadedness. Is getting workup with cardiology. Also, requesting second opinion with [facility].   UPDATE 46/5/14: Since last visit patient having more problems with dizziness and orthostatic lightheadedness. A few days before her DATscan on 01/26/13, patient had significant lightheadedness while standing up and almost passed out. She is getting dressed. She had intermittent episodes of presyncope accompanied by blurred vision, seeing black spots, and losing hearing. Patient has had poor by mouth intake of fluids and eating, because she has lost her appetite. Her sister has noted that patient seems to avoid drinking fluids because she has frequent when she drinks water. Sister has also noticed change in patient's gait, with intermittent freezing and hesitation.  UPDATE 46/5/14: Since last visit, right arm tremor, incoordination, and stiffness have worsened. Right leg feels stiff and heavy. Her right arm and leg "don't do what I want them to do" in her words. Fatigue, dizziness and generalized weakness have continued as well. She is scheduled to see pulmonary and have sleep study soon.  PRIOR HPI (06/21/12): 48 year old right-handed female with iron deficiency anemia, here for evaluation of lightheadedness, muscle weakness, tremor, fatigue. Symptoms have been present for approximately one year. She is noted change in her handwriting, fine  finger movements, generalized muscle weakness with walking and getting up and down from her chair. She also has intermittent lightheadedness when she stands up. She denies decreased by mouth intake with dehydration.  In January or February 2014, patient had a single episode of visual distortion, while talking with a Mudlogger. It looked as though she was looking through water running down the piece of glass. His lasted for a few minutes. She had headache during the day. No nausea, vomiting, photophobia, phonophobia or dizziness.  Patient has had MRI of the brain which was normal. TSH, B12 also normal.  REVIEW OF SYSTEMS: Full 14 system review of systems performed and notable only for fatigue, lightheadedness, headaches, anxiety, poor sleep, anxiety, depression, tremor, voice and swallowing changes.   ALLERGIES: No Known Allergies  HOME MEDICATIONS: Outpatient Prescriptions Prior to Visit  Medication Sig Dispense Refill  . carbidopa-levodopa (SINEMET IR) 25-100 MG per tablet Take 1 tablet by mouth 3 (three) times daily.  90 tablet  6  . famotidine (PEPCID) 20 MG tablet One at bedtime  30 tablet  2  . ipratropium (ATROVENT) 0.03 % nasal spray Place 2 sprays into both nostrils 2 (two) times daily.  30 mL  0  . pantoprazole (PROTONIX) 40 MG tablet Take 1 tablet (40 mg total) by mouth daily. Take 30-60 min before first meal of the day  30 tablet  2   No facility-administered medications prior to visit.    PAST MEDICAL HISTORY: Past Medical History  Diagnosis Date  . Anemia   . Shortness of breath     from anemia    PAST SURGICAL HISTORY: Past Surgical History  Procedure Laterality Date  . Cesarean section    . Tubal ligation    .  Dilitation & currettage/hystroscopy with versapoint resection N/A 12/02/2012    Procedure: DILATATION & CURETTAGE/HYSTEROSCOPY WITH VERSAPOINT RESECTION;  Surgeon: Princess Bruins, MD;  Location: [facility];  Service: Gynecology;  Laterality: N/A;    FAMILY  HISTORY: Family History  Problem Relation Age of Onset  . Cancer Mother     neuro-endocrine syndrome  . Hypertension Mother   . Heart disease Father   . Hypertension Father   . Diabetes Father   . Heart failure Father   . Asthma Sister     SOCIAL HISTORY:  History   Social History  . Marital Status: Married    Spouse Name: Nadara Mustard    Number of Children: 1  . 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     Social History Main Topics  . Smoking status: Never Smoker   . Smokeless tobacco: Never Used  . Alcohol Use: 1 - 1.5 oz/week    2-3 drink(s) per week     Comment: once or twice a month  . Drug Use: No  . Sexual Activity: Yes    Birth Control/ Protection: None   Other Topics Concern  . Not on file   Social History Narrative   Pt lives at home with her spouse and daughter.   Caffeine Use- 6 to 8oz's daily     PHYSICAL EXAM  Filed Vitals:   04/10/13 0936  BP: 121/81  Pulse: 92  Height: 5' 5.5" (1.664 m)  Weight: 230 lb (104.327 kg)        SUPINE   STANDING   Body mass index is 37.68 kg/(m^2).  GENERAL EXAM: Patient is in no distress; well developed, nourished and groomed; MASKED FACIES. NEG SNOUT. BORDERLINE MYERSON'S.  CARDIOVASCULAR: Regular rate and rhythm, no murmurs, no carotid bruits  NEUROLOGIC: MENTAL STATUS: awake, alert, oriented to person, place and time, normal attention and concentration, language fluent, comprehension intact, naming intact, fund of knowledge appropriate CRANIAL NERVE: no papilledema on fundoscopic exam, pupils equal and reactive to light, visual fields full to confrontation, extraocular muscles intact, no nystagmus, facial sensation and strength symmetric, hearing intact, palate elevates symmetrically, uvula midline, shoulder shrug symmetric, tongue midline. MOTOR: MINIMAL POSTURAL TREMOR IN RUE > LUE. INCREASED TONE IN RUE AND RLE. MODERATE BRADYKINESIA IN RUE AND RLE; MILD  BRADYKINESIA IN LEFT SIDE. Full strength in the BUE, BLE SENSORY: normal and symmetric to light touch, temperature, vibration COORDINATION: finger-nose-finger, fine finger movements normal REFLEXES: deep tendon reflexes present and symmetric GAIT/STATION: narrow based gait; DECR ARM SWING.    DIAGNOSTIC DATA (LABS, IMAGING, TESTING) - I reviewed patient records, labs, notes, testing and imaging myself where available.  Lab Results  Component Value Date   WBC 3.6* 02/10/2013   HGB 12.0 02/10/2013   HCT 35.7* 02/10/2013   MCV 92.6 02/10/2013   PLT 389.0 02/10/2013      Component Value Date/Time   NA 135 02/10/2013 1522   No results found for this basename: CHOL,  HDL,  LDLCALC,  LDLDIRECT,  TRIG,  CHOLHDL   No results found for this basename: HGBA1C   Lab Results  Component Value Date   VITAMINB12 910 03/30/2012   Lab Results  Component Value Date   TSH 0.74 02/10/2013   Lab Results  Component Value Date   CKTOTAL 74 06/21/2012   Aldolase  Date Value Range Status  06/21/2012 3.2  1.2 - 7.6 U/L Final   AChR Binding Ab,  Serum  Date Value Range Status  06/21/2012 <0.03  0.00 - 0.24 nmol/L Final                                    Negative:   0.00 - 0.24                                    Borderline: 0.25 - 0.40                                    Positive:        > 0.40   01/11/13 SERUM COPPER - 123 (72-166 ug/dL)  Ceruloplasmin  Date Value Range Status  01/11/2013 27.9  16.0 - 45.0 mg/dL Final   Lab Results  Component Value Date   ALT 12 02/10/2013   AST 20 02/10/2013   ALKPHOS 60 02/10/2013   BILITOT 0.9 02/10/2013    04/14/12 MRI brain - normal  01/26/13 MRI brain - normal; I reviewed images myself and agree with interpretation.   01/26/13 MRI cervical - mild spondylitic changes at C4-5 and C5-6 but without significant compression. No enhancing lesions are noted; I reviewed images myself and agree with interpretation.  01/26/13 DAT scan  - virtually absent uptake  bilaterally affecting both putamen and caudate nuclear. This pattern can be seen in Parkinson's disease or related symptoms.   ASSESSMENT AND PLAN  48 y.o. year old female  has a past medical history of Anemia and Shortness of breath. here with constellation of symptoms including transient visual distortion and headaches (likely migraine), and generalized fatigue, intermittent dizziness, and extrapyramidal signs and symptoms on the right arm and right leg.   Ddx: parkinson's disease vs parkinson's plus syndrome (MSA or CBD)   PLAN: - increase carbidopa-levodopa to 1.5 tabs TID - start sertraline 25mg  daily for anxiety/mood - PT/OT/ST referrals - second opinion at [facility] as per patient request - follow up with cardiology re: orthostatic hypotension; also increase fluid intake, monitor vitals at home, raise head of bed with blocks and use compression stocking on legs. may need to consider midodrine or florinef if orthostatic hypotension / autonomic insufficiency continues  Return in about 1 month (around 05/08/2013).    Penni Bombard, MD 10/10/3823, 05:39 AM Certified in Neurology, Neurophysiology and Neuroimaging  [facility] [address] [place], [place] [address] [phone]

## 2013-04-14 ENCOUNTER — Ambulatory Visit (INDEPENDENT_AMBULATORY_CARE_PROVIDER_SITE_OTHER): Payer: Self-pay

## 2013-04-14 ENCOUNTER — Ambulatory Visit (INDEPENDENT_AMBULATORY_CARE_PROVIDER_SITE_OTHER): Payer: BC Managed Care – PPO

## 2013-04-14 ENCOUNTER — Ambulatory Visit (INDEPENDENT_AMBULATORY_CARE_PROVIDER_SITE_OTHER): Payer: BC Managed Care – PPO | Admitting: Neurology

## 2013-04-14 DIAGNOSIS — Z0289 Encounter for other administrative examinations: Secondary | ICD-10-CM

## 2013-04-14 DIAGNOSIS — G9389 Other specified disorders of brain: Secondary | ICD-10-CM

## 2013-04-21 ENCOUNTER — Ambulatory Visit: Payer: BC Managed Care – PPO | Attending: Diagnostic Neuroimaging

## 2013-04-21 ENCOUNTER — Other Ambulatory Visit: Payer: Self-pay | Admitting: Diagnostic Neuroimaging

## 2013-04-21 DIAGNOSIS — G20A1 Parkinson's disease without dyskinesia, without mention of fluctuations: Secondary | ICD-10-CM | POA: Insufficient documentation

## 2013-04-21 DIAGNOSIS — G20 Parkinson's disease: Secondary | ICD-10-CM | POA: Insufficient documentation

## 2013-04-21 DIAGNOSIS — R269 Unspecified abnormalities of gait and mobility: Secondary | ICD-10-CM | POA: Insufficient documentation

## 2013-04-21 DIAGNOSIS — Z5189 Encounter for other specified aftercare: Secondary | ICD-10-CM | POA: Insufficient documentation

## 2013-04-21 DIAGNOSIS — R490 Dysphonia: Secondary | ICD-10-CM | POA: Insufficient documentation

## 2013-04-21 DIAGNOSIS — R471 Dysarthria and anarthria: Secondary | ICD-10-CM | POA: Insufficient documentation

## 2013-04-25 DIAGNOSIS — Z0289 Encounter for other administrative examinations: Secondary | ICD-10-CM

## 2013-04-26 ENCOUNTER — Ambulatory Visit: Payer: BC Managed Care – PPO | Admitting: Physical Therapy

## 2013-05-02 ENCOUNTER — Encounter: Payer: BC Managed Care – PPO | Admitting: Occupational Therapy

## 2013-05-04 ENCOUNTER — Ambulatory Visit: Payer: BC Managed Care – PPO | Admitting: Occupational Therapy

## 2013-05-04 ENCOUNTER — Ambulatory Visit: Payer: BC Managed Care – PPO

## 2013-05-08 ENCOUNTER — Ambulatory Visit: Payer: BC Managed Care – PPO

## 2013-05-08 ENCOUNTER — Ambulatory Visit: Payer: BC Managed Care – PPO | Attending: Diagnostic Neuroimaging | Admitting: Physical Therapy

## 2013-05-08 DIAGNOSIS — G20 Parkinson's disease: Secondary | ICD-10-CM | POA: Insufficient documentation

## 2013-05-08 DIAGNOSIS — Z5189 Encounter for other specified aftercare: Secondary | ICD-10-CM | POA: Insufficient documentation

## 2013-05-08 DIAGNOSIS — R490 Dysphonia: Secondary | ICD-10-CM | POA: Insufficient documentation

## 2013-05-08 DIAGNOSIS — G20A1 Parkinson's disease without dyskinesia, without mention of fluctuations: Secondary | ICD-10-CM | POA: Insufficient documentation

## 2013-05-08 DIAGNOSIS — R471 Dysarthria and anarthria: Secondary | ICD-10-CM | POA: Insufficient documentation

## 2013-05-08 DIAGNOSIS — R269 Unspecified abnormalities of gait and mobility: Secondary | ICD-10-CM | POA: Insufficient documentation

## 2013-05-12 ENCOUNTER — Ambulatory Visit: Payer: BC Managed Care – PPO | Admitting: Occupational Therapy

## 2013-05-12 ENCOUNTER — Ambulatory Visit: Payer: BC Managed Care – PPO

## 2013-05-15 ENCOUNTER — Encounter: Payer: Self-pay | Admitting: Diagnostic Neuroimaging

## 2013-05-15 ENCOUNTER — Ambulatory Visit (INDEPENDENT_AMBULATORY_CARE_PROVIDER_SITE_OTHER): Payer: BC Managed Care – PPO | Admitting: Diagnostic Neuroimaging

## 2013-05-15 VITALS — BP 109/78 | HR 82 | Ht 65.5 in | Wt 229.0 lb

## 2013-05-15 DIAGNOSIS — I951 Orthostatic hypotension: Secondary | ICD-10-CM

## 2013-05-15 DIAGNOSIS — G20 Parkinson's disease: Secondary | ICD-10-CM

## 2013-05-15 NOTE — Patient Instructions (Signed)
Continue current medications and physical therapy.

## 2013-05-15 NOTE — Progress Notes (Signed)
[facility]  PATIENT: Kristin Gates DOB: 30-Oct-1965  REFERRING CLINICIAN:  HISTORY FROM: patient REASON FOR VISIT: follow up   HISTORICAL  CHIEF COMPLAINT:  Chief Complaint  Patient presents with  . Follow-up    PD    HISTORY OF PRESENT ILLNESS:   UPDATE 05/15/13: Since last visit, carb/levo higher dose is helping with stiffness. She doesn't notice tremor that much. No on-off fluctuations. Now on florinef and stockings for orthostatic hypotension. [facility] appt scheduled for Apr 8. Still out of work. Wants to look at part time / light duty options.  UPDATE 04/10/13: Since last visit, carb/levo is helping with some stiffness and tremor control. No wearing off. Overall, patient struggling more with her work. More anxiety, jittery feelings. Voice is getting soft and hoarse. Poor sleep. More orthostatic lightheadedness. Is getting workup with cardiology. Also, requesting second opinion with [facility].   UPDATE 02/10/13: Since last visit patient having more problems with dizziness and orthostatic lightheadedness. A few days before her DATscan on 01/26/13, patient had significant lightheadedness while standing up and almost passed out. She is getting dressed. She had intermittent episodes of presyncope accompanied by blurred vision, seeing black spots, and losing hearing. Patient has had poor by mouth intake of fluids and eating, because she has lost her appetite. Her sister has noted that patient seems to avoid drinking fluids because she has frequent when she drinks water. Sister has also noticed change in patient's gait, with intermittent freezing and hesitation.  UPDATE 01/11/13: Since last visit, right arm tremor, incoordination, and stiffness have worsened. Right leg feels stiff and heavy. Her right arm and leg "don't do what I want them to do" in her words. Fatigue, dizziness and generalized weakness have continued as well. She is scheduled to see  pulmonary and have sleep study soon.  PRIOR HPI (06/21/12): 48 year old right-handed female with iron deficiency anemia, here for evaluation of lightheadedness, muscle weakness, tremor, fatigue. Symptoms have been present for approximately one year. She is noted change in her handwriting, fine finger movements, generalized muscle weakness with walking and getting up and down from her chair. She also has intermittent lightheadedness when she stands up. She denies decreased by mouth intake with dehydration.  In January or February 2014, patient had a single episode of visual distortion, while talking with a Mudlogger. It looked as though she was looking through water running down the piece of glass. His lasted for a few minutes. She had headache during the day. No nausea, vomiting, photophobia, phonophobia or dizziness.  Patient has had MRI of the brain which was normal. TSH, B12 also normal.  REVIEW OF SYSTEMS: Full 14 system review of systems performed and notable only for fatigue, lightheadedness, voice and swallowing changes.   ALLERGIES: No Known Allergies  HOME MEDICATIONS: Outpatient Prescriptions Prior to Visit  Medication Sig Dispense Refill  . carbidopa-levodopa (SINEMET IR) 25-100 MG per tablet Take 1.5 tablets by mouth 3 (three) times daily.  180 tablet  6  . sertraline (ZOLOFT) 25 MG tablet Take 1 tablet (25 mg total) by mouth daily.  30 tablet  6   No facility-administered medications prior to visit.    PAST MEDICAL HISTORY: Past Medical History  Diagnosis Date  . Anemia   . Shortness of breath     from anemia    PAST SURGICAL HISTORY: Past Surgical History  Procedure Laterality Date  . Cesarean section    . Tubal ligation    . Dilitation &  currettage/hystroscopy with versapoint resection N/A 12/02/2012    Procedure: DILATATION & CURETTAGE/HYSTEROSCOPY WITH VERSAPOINT RESECTION;  Surgeon: Princess Bruins, MD;  Location: [facility];  Service: Gynecology;  Laterality: N/A;      FAMILY HISTORY: Family History  Problem Relation Age of Onset  . Cancer Mother     neuro-endocrine syndrome  . Hypertension Mother   . Heart disease Father   . Hypertension Father   . Diabetes Father   . Heart failure Father   . Asthma Sister     SOCIAL HISTORY:  History   Social History  . Marital Status: Married    Spouse Name: Nadara Mustard    Number of Children: 1  . 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     Social History Main Topics  . Smoking status: Never Smoker   . Smokeless tobacco: Never Used  . Alcohol Use: 1 - 1.5 oz/week    2-3 drink(s) per week     Comment: once or twice a month  . Drug Use: No  . Sexual Activity: Yes    Birth Control/ Protection: None   Other Topics Concern  . Not on file   Social History Narrative   Pt lives at home with her spouse and daughter.   Caffeine Use- 6 to 8oz's daily     PHYSICAL EXAM  Filed Vitals:   05/15/13 0845  BP: 109/78  Pulse: 82  Height: 5' 5.5" (1.664 m)  Weight: 229 lb (103.874 kg)       Body mass index is 37.51 kg/(m^2).  GENERAL EXAM: Patient is in no distress; well developed, nourished and groomed; MASKED FACIES. NEG SNOUT. BORDERLINE MYERSON'S.  CARDIOVASCULAR: Regular rate and rhythm, no murmurs, no carotid bruits  NEUROLOGIC: MENTAL STATUS: awake, alert, oriented to person, place and time, normal attention and concentration, language fluent, comprehension intact, naming intact, fund of knowledge appropriate CRANIAL NERVE: no papilledema on fundoscopic exam, pupils equal and reactive to light, visual fields full to confrontation, extraocular muscles intact, no nystagmus, facial sensation and strength symmetric, hearing intact, palate elevates symmetrically, uvula midline, shoulder shrug symmetric, tongue midline. MOTOR: MINIMAL POSTURAL/ACTION TREMOR IN RUE > LUE. INCREASED TONE IN RUE AND RLE. MILD-MODERATE BRADYKINESIA IN BUE; MODERATE  BRADYKINESIA IN BLE. Full strength in the BUE, BLE SENSORY: normal and symmetric to light touch, temperature, vibration COORDINATION: finger-nose-finger, fine finger movements SLOW. REFLEXES: deep tendon reflexes present and symmetric GAIT/STATION: narrow based gait; DECR ARM SWING. 25 FOOT WALK (6sec, 7 sec).    DIAGNOSTIC DATA (LABS, IMAGING, TESTING) - I reviewed patient records, labs, notes, testing and imaging myself where available.  Lab Results  Component Value Date   WBC 3.6* 02/10/2013   HGB 12.0 02/10/2013   HCT 35.7* 02/10/2013   MCV 92.6 02/10/2013   PLT 389.0 02/10/2013      Component Value Date/Time   NA 135 02/10/2013 1522   No results found for this basename: CHOL,  HDL,  LDLCALC,  LDLDIRECT,  TRIG,  CHOLHDL   No results found for this basename: HGBA1C   Lab Results  Component Value Date   VITAMINB12 910 03/30/2012   Lab Results  Component Value Date   TSH 0.74 02/10/2013   Lab Results  Component Value Date   CKTOTAL 74 06/21/2012   Aldolase  Date Value Ref Range Status  06/21/2012 3.2  1.2 - 7.6 U/L Final   AChR Binding Ab, Serum  Date Value  Ref Range Status  06/21/2012 <0.03  0.00 - 0.24 nmol/L Final                                    Negative:   0.00 - 0.24                                    Borderline: 0.25 - 0.40                                    Positive:        > 0.40   01/11/13 SERUM COPPER - 123 (72-166 ug/dL)  Ceruloplasmin  Date Value Ref Range Status  01/11/2013 27.9  16.0 - 45.0 mg/dL Final   Lab Results  Component Value Date   ALT 12 02/10/2013   AST 20 02/10/2013   ALKPHOS 60 02/10/2013   BILITOT 0.9 02/10/2013    04/14/12 MRI brain - normal  01/26/13 MRI brain - normal; I reviewed images myself and agree with interpretation.   01/26/13 MRI cervical - mild spondylitic changes at C4-5 and C5-6 but without significant compression. No enhancing lesions are noted; I reviewed images myself and agree with interpretation.  01/26/13 DAT scan   - virtually absent uptake bilaterally affecting both putamen and caudate nuclear. This pattern can be seen in Parkinson's disease or related symptoms.   ASSESSMENT AND PLAN  48 y.o. year old female  has a past medical history of Anemia and Shortness of breath. here with constellation of symptoms including transient visual distortion and headaches (likely migraine), and generalized fatigue, intermittent dizziness, and extrapyramidal signs and symptoms in the arms and legs (right greater than left).  Ddx: parkinson's disease (akinetic rigid) vs parkinson's plus syndrome (MSA or CBD)   PLAN: - continue carbidopa-levodopa to 1.5 tabs TID - I suggested to add azilect or increase carb/levo, but patient declined - PT/OT/ST treatments ongoing - second opinion at [facility] as per patient request - continue florinef per cardiology re: orthostatic hypotension; also increase fluid intake, monitor vitals at home, raise head of bed with blocks and use compression stocking on legs; may consider northera (droxidopa) in future - wrote note to be out of work x  2 months  Return in about 2 months (around 07/15/2013).    Penni Bombard, MD 07/11/4918, 1:00 AM Certified in Neurology, Neurophysiology and Neuroimaging  [facility] [address], [place], [place] [address] [phone]

## 2013-05-16 ENCOUNTER — Ambulatory Visit: Payer: BC Managed Care – PPO | Admitting: Occupational Therapy

## 2013-05-16 ENCOUNTER — Ambulatory Visit: Payer: BC Managed Care – PPO | Admitting: Physical Therapy

## 2013-05-16 ENCOUNTER — Ambulatory Visit: Payer: BC Managed Care – PPO

## 2013-05-17 NOTE — Telephone Encounter (Signed)
Pt came in for her visit closing encounter °

## 2013-05-19 ENCOUNTER — Ambulatory Visit: Payer: BC Managed Care – PPO

## 2013-05-19 ENCOUNTER — Ambulatory Visit: Payer: BC Managed Care – PPO | Admitting: Occupational Therapy

## 2013-05-19 ENCOUNTER — Ambulatory Visit: Payer: BC Managed Care – PPO | Admitting: Physical Therapy

## 2013-05-22 DIAGNOSIS — Z0289 Encounter for other administrative examinations: Secondary | ICD-10-CM

## 2013-05-23 ENCOUNTER — Ambulatory Visit: Payer: BC Managed Care – PPO | Admitting: Physical Therapy

## 2013-05-23 ENCOUNTER — Ambulatory Visit: Payer: BC Managed Care – PPO | Admitting: Occupational Therapy

## 2013-05-23 ENCOUNTER — Ambulatory Visit: Payer: BC Managed Care – PPO

## 2013-05-25 ENCOUNTER — Ambulatory Visit: Payer: BC Managed Care – PPO

## 2013-05-25 ENCOUNTER — Ambulatory Visit: Payer: BC Managed Care – PPO | Admitting: Occupational Therapy

## 2013-05-25 ENCOUNTER — Ambulatory Visit: Payer: BC Managed Care – PPO | Admitting: Physical Therapy

## 2013-05-30 ENCOUNTER — Encounter: Payer: BC Managed Care – PPO | Admitting: Speech Pathology

## 2013-05-30 ENCOUNTER — Ambulatory Visit: Payer: BC Managed Care – PPO | Admitting: Physical Therapy

## 2013-06-01 ENCOUNTER — Ambulatory Visit: Payer: BC Managed Care – PPO | Admitting: Occupational Therapy

## 2013-06-01 ENCOUNTER — Encounter: Payer: BC Managed Care – PPO | Admitting: Speech Pathology

## 2013-06-05 ENCOUNTER — Ambulatory Visit: Payer: BC Managed Care – PPO | Admitting: Physical Therapy

## 2013-06-07 ENCOUNTER — Ambulatory Visit: Payer: BC Managed Care – PPO | Attending: Diagnostic Neuroimaging | Admitting: Physical Therapy

## 2013-06-07 DIAGNOSIS — Z5189 Encounter for other specified aftercare: Secondary | ICD-10-CM | POA: Diagnosis not present

## 2013-06-07 DIAGNOSIS — R490 Dysphonia: Secondary | ICD-10-CM | POA: Diagnosis not present

## 2013-06-07 DIAGNOSIS — R471 Dysarthria and anarthria: Secondary | ICD-10-CM | POA: Diagnosis not present

## 2013-06-07 DIAGNOSIS — G20A1 Parkinson's disease without dyskinesia, without mention of fluctuations: Secondary | ICD-10-CM | POA: Insufficient documentation

## 2013-06-07 DIAGNOSIS — R269 Unspecified abnormalities of gait and mobility: Secondary | ICD-10-CM | POA: Diagnosis not present

## 2013-06-07 DIAGNOSIS — G20 Parkinson's disease: Secondary | ICD-10-CM | POA: Diagnosis not present

## 2013-06-08 ENCOUNTER — Ambulatory Visit: Payer: BC Managed Care – PPO | Admitting: Physical Therapy

## 2013-06-08 ENCOUNTER — Ambulatory Visit: Payer: BC Managed Care – PPO | Admitting: Occupational Therapy

## 2013-06-08 DIAGNOSIS — Z5189 Encounter for other specified aftercare: Secondary | ICD-10-CM | POA: Diagnosis not present

## 2013-06-13 ENCOUNTER — Encounter: Payer: BC Managed Care – PPO | Admitting: Occupational Therapy

## 2013-06-13 ENCOUNTER — Ambulatory Visit: Payer: BC Managed Care – PPO | Admitting: Physical Therapy

## 2013-06-14 ENCOUNTER — Encounter: Payer: BC Managed Care – PPO | Admitting: Occupational Therapy

## 2013-06-14 ENCOUNTER — Ambulatory Visit: Payer: BC Managed Care – PPO | Admitting: Physical Therapy

## 2013-06-20 ENCOUNTER — Ambulatory Visit: Payer: BC Managed Care – PPO | Admitting: Physical Therapy

## 2013-06-20 ENCOUNTER — Encounter: Payer: BC Managed Care – PPO | Admitting: Speech Pathology

## 2013-06-20 ENCOUNTER — Ambulatory Visit: Payer: BC Managed Care – PPO | Admitting: Occupational Therapy

## 2013-06-20 DIAGNOSIS — Z5189 Encounter for other specified aftercare: Secondary | ICD-10-CM | POA: Diagnosis not present

## 2013-06-20 DIAGNOSIS — F4321 Adjustment disorder with depressed mood: Secondary | ICD-10-CM

## 2013-06-22 ENCOUNTER — Encounter: Payer: BC Managed Care – PPO | Admitting: Speech Pathology

## 2013-06-22 ENCOUNTER — Ambulatory Visit: Payer: BC Managed Care – PPO | Admitting: Physical Therapy

## 2013-06-22 ENCOUNTER — Ambulatory Visit: Payer: BC Managed Care – PPO | Admitting: Occupational Therapy

## 2013-06-22 DIAGNOSIS — Z5189 Encounter for other specified aftercare: Secondary | ICD-10-CM | POA: Diagnosis not present

## 2013-06-23 ENCOUNTER — Telehealth: Payer: Self-pay | Admitting: Diagnostic Neuroimaging

## 2013-06-23 NOTE — Telephone Encounter (Signed)
Lattie Haw with [employer] calling [phone]) regarding she needs Dr. Leta Baptist to support the patient's date of disability as February 3 (currently it states March 9th). She states that some documents received support the date but she needs authorization. A message can be left on her direct private line [phone] confirming this.

## 2013-06-26 NOTE — Telephone Encounter (Signed)
Sandy, pls check this, but it is probably ok. -VRP

## 2013-06-27 ENCOUNTER — Ambulatory Visit: Payer: BC Managed Care – PPO | Admitting: Physical Therapy

## 2013-06-27 DIAGNOSIS — Z5189 Encounter for other specified aftercare: Secondary | ICD-10-CM | POA: Diagnosis not present

## 2013-06-27 NOTE — Telephone Encounter (Signed)
I called direct line and it is a (# for sleep number bed), tried twice.  The [phone] spoke to [place], they did not have pt on file. (with name, DOB ).  I did not have claim number.  I called pt and LMVM for her to call back, that I needed her assistance in trying to connect with [employer].

## 2013-06-28 NOTE — Telephone Encounter (Signed)
Kristin Gates w/[employer] wants to know if doctor will support date of disability as 04-11-2014--please call ( I got a different #)--can leave message--thank you.

## 2013-06-29 ENCOUNTER — Encounter: Payer: BC Managed Care – PPO | Admitting: Occupational Therapy

## 2013-06-29 ENCOUNTER — Ambulatory Visit: Payer: BC Managed Care – PPO | Admitting: Physical Therapy

## 2013-06-29 NOTE — Telephone Encounter (Signed)
I called and LMVM for Kristin Gates with [employer]alles [phone], that probably ok to use 04-11-13 as the disability start date per Dr. Leta Baptist.    Kristin Gates to call back if needed.

## 2013-07-03 NOTE — Telephone Encounter (Signed)
Finally spoke to Bagley with [facility].    Date of 04-11-13 ok to use.  Pt has appt 08-15-13 will assess again.  Form previously filled out notes 09-06-13 out till.  Pt to bring in form when comes for her f/u appt.

## 2013-07-04 ENCOUNTER — Ambulatory Visit: Payer: BC Managed Care – PPO | Admitting: Occupational Therapy

## 2013-07-04 ENCOUNTER — Ambulatory Visit: Payer: BC Managed Care – PPO | Admitting: Physical Therapy

## 2013-07-04 DIAGNOSIS — Z5189 Encounter for other specified aftercare: Secondary | ICD-10-CM | POA: Diagnosis not present

## 2013-07-05 ENCOUNTER — Ambulatory Visit: Payer: BC Managed Care – PPO | Admitting: Occupational Therapy

## 2013-07-05 ENCOUNTER — Telehealth: Payer: Self-pay | Admitting: *Deleted

## 2013-07-05 DIAGNOSIS — Z5189 Encounter for other specified aftercare: Secondary | ICD-10-CM | POA: Diagnosis not present

## 2013-07-05 NOTE — Telephone Encounter (Signed)
Pt calling stating that she came in today to leave her notes from her visit at [facility] and states that they would like to put her on Requip and Mirapex and wanted to know what your thoughts were about this and also wanted to know about her return to work. Pt would like Dr. Leta Baptist to give her a call back please. Thanks

## 2013-07-06 ENCOUNTER — Encounter: Payer: Self-pay | Admitting: *Deleted

## 2013-07-06 ENCOUNTER — Ambulatory Visit: Payer: BC Managed Care – PPO | Admitting: Physical Therapy

## 2013-07-06 DIAGNOSIS — Z5189 Encounter for other specified aftercare: Secondary | ICD-10-CM | POA: Diagnosis not present

## 2013-07-06 NOTE — Telephone Encounter (Signed)
Pt in for rehab next door.  She came by to clarify date of return to work on her [employer] form.  After consulting with Dr. Leta Baptist, the date 09-06-13 was changed on form.  Form refaxed with addendum to [employer] with successful transmission.  Both the [employer] and [employer] both with same dates.  Pt has appt 08-15-13 for re-eval.  Pt given original form with new date, along with fax transmission.  We spoke  if any medication changes that she would need appt.  Mirapex and requip used for jerking movements.  I made changes to med list as per Dr. Nicki Reaper recommendations.

## 2013-07-10 ENCOUNTER — Telehealth: Payer: Self-pay | Admitting: Diagnostic Neuroimaging

## 2013-07-10 MED ORDER — CARBIDOPA-LEVODOPA 25-100 MG PO TABS: 2.0000 | ORAL_TABLET | Freq: Three times a day (TID) | ORAL | Status: AC

## 2013-07-10 NOTE — Telephone Encounter (Signed)
Rx has been sent  

## 2013-07-10 NOTE — Telephone Encounter (Signed)
Pt called to request refill for her medication carbidopa-levodopa (SINEMET IR) 25-100 MG per tablet, pt states it needs to show on there take 2 tablets by mouth 3 (three) times daily. Pt states she only gets 90 and that is not enough. Please call pt concerning this matter and when it has been called in. Thanks

## 2013-07-10 NOTE — Telephone Encounter (Signed)
Go ahead with new rx. -VRP

## 2013-07-10 NOTE — Telephone Encounter (Signed)
Dr Nicki Reaper at [facility] recommended she increase Sinemet to 2 tabs three times daily.  Patient is requesting a new Rx for this dose.  Okay to send or should patient obtain med from Dr Nicki Reaper?  Please advise.  Thank you.

## 2013-07-17 ENCOUNTER — Telehealth: Payer: Self-pay | Admitting: *Deleted

## 2013-07-17 NOTE — Telephone Encounter (Signed)
Patient called to inform Dr Leta Baptist, she saw Dr Baird Cancer and she ruled out UTI.  Could he call her in a rx for the symptoms caused by her MS.  Thanks

## 2013-07-21 NOTE — Telephone Encounter (Signed)
Called pt and pt stated that she went to see Dr. Baird Cancer and the doctor gave her some samples of Myrbetriq 25 mg, to take daily, but pt states that she was instructed not to take it until she heard from Dr. Leta Baptist stating that it was okay to take this medication with the medications pt is already taking. Pt stated that she is suppose to take this for her frequent urination that she has been having. Pt would like Dr. Leta Baptist to give a call back please. Thanks

## 2013-07-21 NOTE — Telephone Encounter (Signed)
Patient calling back--she never did get a returned call--please call--patient upset.

## 2013-07-21 NOTE — Telephone Encounter (Signed)
I called patient. She should discuss with her PCP about those samples. -VRP

## 2013-08-15 ENCOUNTER — Encounter: Payer: Self-pay | Admitting: Diagnostic Neuroimaging

## 2013-08-15 ENCOUNTER — Ambulatory Visit (INDEPENDENT_AMBULATORY_CARE_PROVIDER_SITE_OTHER): Payer: BC Managed Care – PPO | Admitting: Diagnostic Neuroimaging

## 2013-08-15 ENCOUNTER — Encounter (INDEPENDENT_AMBULATORY_CARE_PROVIDER_SITE_OTHER): Payer: Self-pay

## 2013-08-15 VITALS — BP 134/87 | HR 83 | Temp 98.1°F | Ht 65.5 in | Wt 224.0 lb

## 2013-08-15 DIAGNOSIS — G20 Parkinson's disease: Secondary | ICD-10-CM

## 2013-08-15 DIAGNOSIS — R42 Dizziness and giddiness: Secondary | ICD-10-CM

## 2013-08-15 DIAGNOSIS — F32A Depression, unspecified: Secondary | ICD-10-CM

## 2013-08-15 DIAGNOSIS — F329 Major depressive disorder, single episode, unspecified: Secondary | ICD-10-CM

## 2013-08-15 DIAGNOSIS — I951 Orthostatic hypotension: Secondary | ICD-10-CM

## 2013-08-15 DIAGNOSIS — F3289 Other specified depressive episodes: Secondary | ICD-10-CM

## 2013-08-15 MED ORDER — VENLAFAXINE HCL ER 37.5 MG PO CP24: 37.5000 mg | ORAL_CAPSULE | Freq: Every day | ORAL | Status: AC

## 2013-08-15 NOTE — Patient Instructions (Signed)
Try venlafaxine 37.5 mg daily.  Use rollator walker for long distances if you feel off balance.

## 2013-08-15 NOTE — Progress Notes (Signed)
[facility]  PATIENT: Kristin Gates DOB: Mar 05, 1966  REFERRING CLINICIAN:  HISTORY FROM: patient REASON FOR VISIT: follow up   HISTORICAL  CHIEF COMPLAINT:  Chief Complaint  Patient presents with  . Follow-up    PD    HISTORY OF PRESENT ILLNESS:   UPDATE 08/15/13: Since last visit continue to have intermittent orthostatic hypotension and dizziness. Had second opinion at [facility], who agreed with PD diagnosis. Depression is worsening.   UPDATE 05/15/13: Since last visit, carb/levo higher dose is helping with stiffness. She doesn't notice tremor that much. No on-off fluctuations. Now on florinef and stockings for orthostatic hypotension. [facility] appt scheduled for Apr 8. Still out of work. Wants to look at part time / light duty options.  UPDATE 04/10/13: Since last visit, carb/levo is helping with some stiffness and tremor control. No wearing off. Overall, patient struggling more with her work. More anxiety, jittery feelings. Voice is getting soft and hoarse. Poor sleep. More orthostatic lightheadedness. Is getting workup with cardiology. Also, requesting second opinion with [facility] disease clinic.   UPDATE 02/10/13: Since last visit patient having more problems with dizziness and orthostatic lightheadedness. A few days before her DATscan on 01/26/13, patient had significant lightheadedness while standing up and almost passed out. She is getting dressed. She had intermittent episodes of presyncope accompanied by blurred vision, seeing black spots, and losing hearing. Patient has had poor by mouth intake of fluids and eating, because she has lost her appetite. Her sister has noted that patient seems to avoid drinking fluids because she has frequent when she drinks water. Sister has also noticed change in patient's gait, with intermittent freezing and hesitation.  UPDATE 01/11/13: Since last visit, right arm tremor, incoordination, and stiffness have worsened. Right  leg feels stiff and heavy. Her right arm and leg "don't do what I want them to do" in her words. Fatigue, dizziness and generalized weakness have continued as well. She is scheduled to see pulmonary and have sleep study soon.  PRIOR HPI (06/21/12): 48 year old right-handed female with iron deficiency anemia, here for evaluation of lightheadedness, muscle weakness, tremor, fatigue. Symptoms have been present for approximately one year. She is noted change in her handwriting, fine finger movements, generalized muscle weakness with walking and getting up and down from her chair. She also has intermittent lightheadedness when she stands up. She denies decreased by mouth intake with dehydration. In January or February 2014, patient had a single episode of visual distortion, while talking with a Mudlogger. It looked as though she was looking through water running down the piece of glass. His lasted for a few minutes. She had headache during the day. No nausea, vomiting, photophobia, phonophobia or dizziness. Patient has had MRI of the brain which was normal. TSH, B12 also normal.  REVIEW OF SYSTEMS: Full 14 system review of systems performed and notable only for fatigue, lightheadedness, voice and swallowing changes.   ALLERGIES: No Known Allergies  HOME MEDICATIONS: Outpatient Prescriptions Prior to Visit  Medication Sig Dispense Refill  . carbidopa-levodopa (SINEMET IR) 25-100 MG per tablet Take 2 tablets by mouth 3 (three) times daily.  180 tablet  3  . fludrocortisone (FLORINEF) 0.1mg /mL SUSP Take 0.1 mg by mouth at bedtime.       No facility-administered medications prior to visit.    PAST MEDICAL HISTORY: Past Medical History  Diagnosis Date  . Anemia   . Shortness of breath     from anemia    PAST SURGICAL HISTORY:  Past Surgical History  Procedure Laterality Date  . Cesarean section    . Tubal ligation    . Dilitation & currettage/hystroscopy with versapoint resection N/A 12/02/2012     Procedure: DILATATION & CURETTAGE/HYSTEROSCOPY WITH VERSAPOINT RESECTION;  Surgeon: Princess Bruins, MD;  Location: [facility];  Service: Gynecology;  Laterality: N/A;    FAMILY HISTORY: Family History  Problem Relation Age of Onset  . Cancer Mother     neuro-endocrine syndrome  . Hypertension Mother   . Heart disease Father   . Hypertension Father   . Diabetes Father   . Heart failure Father   . Asthma Sister     SOCIAL HISTORY:  History   Social History  . Marital Status: Married    Spouse Name: Nadara Mustard    Number of Children: 1  . 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     Social History Main Topics  . Smoking status: Never Smoker   . Smokeless tobacco: Never Used  . Alcohol Use: 1.0 - 1.5 oz/week    2-3 drink(s) per week     Comment: once or twice a month  . Drug Use: No  . Sexual Activity: Yes    Birth Control/ Protection: None   Other Topics Concern  . Not on file   Social History Narrative   Pt lives at home with her spouse and daughter.   Caffeine Use- 6 to 8oz's daily     PHYSICAL EXAM  Filed Vitals:   08/15/13 1124  BP: 134/87  Pulse: 83  Temp: 98.1 F (36.7 C)  TempSrc: Oral  Height: 5' 5.5" (1.664 m)  Weight: 224 lb (101.606 kg)       Body mass index is 36.7 kg/(m^2).  GENERAL EXAM: Patient is in no distress; well developed, nourished and groomed; MASKED FACIES. NEG SNOUT. BORDERLINE MYERSON'S.  CARDIOVASCULAR: Regular rate and rhythm, no murmurs, no carotid bruits  NEUROLOGIC: MENTAL STATUS: awake, alert, oriented to person, place and time, normal attention and concentration, language fluent, comprehension intact, naming intact, fund of knowledge appropriate CRANIAL NERVE: no papilledema on fundoscopic exam, pupils equal and reactive to light, visual fields full to confrontation, extraocular muscles intact, no nystagmus, facial sensation and strength symmetric, hearing intact, palate elevates  symmetrically, uvula midline, shoulder shrug symmetric, tongue midline. MOTOR: MINIMAL POSTURAL/ACTION TREMOR IN RUE > LUE. INCREASED TONE IN RUE AND RLE. MILD-MODERATE BRADYKINESIA IN BUE; MODERATE BRADYKINESIA IN BLE. Full strength in the BUE, BLE SENSORY: normal and symmetric to light touch, temperature, vibration COORDINATION: finger-nose-finger, fine finger movements SLOW. REFLEXES: deep tendon reflexes present and symmetric GAIT/STATION: narrow based gait; DECR ARM SWING.   DIAGNOSTIC DATA (LABS, IMAGING, TESTING) - I reviewed patient records, labs, notes, testing and imaging myself where available.  Lab Results  Component Value Date   WBC 3.6* 02/10/2013   HGB 12.0 02/10/2013   HCT 35.7* 02/10/2013   MCV 92.6 02/10/2013   PLT 389.0 02/10/2013      Component Value Date/Time   NA 135 02/10/2013 1522   No results found for this basename: CHOL,  HDL,  LDLCALC,  LDLDIRECT,  TRIG,  CHOLHDL   No results found for this basename: HGBA1C   Lab Results  Component Value Date   VITAMINB12 910 03/30/2012   Lab Results  Component Value Date   TSH 0.74 02/10/2013   Lab Results  Component Value Date   CKTOTAL 74 06/21/2012   Aldolase  Date Value Ref Range Status  06/21/2012 3.2  1.2 - 7.6 U/L Final   AChR Binding Ab, Serum  Date Value Ref Range Status  06/21/2012 <0.03  0.00 - 0.24 nmol/L Final                                    Negative:   0.00 - 0.24                                    Borderline: 0.25 - 0.40                                    Positive:        > 0.40   01/11/13 SERUM COPPER - 123 (72-166 ug/dL)  Ceruloplasmin  Date Value Ref Range Status  01/11/2013 27.9  16.0 - 45.0 mg/dL Final   Lab Results  Component Value Date   ALT 12 02/10/2013   AST 20 02/10/2013   ALKPHOS 60 02/10/2013   BILITOT 0.9 02/10/2013    04/14/12 MRI brain - normal  01/26/13 MRI brain - normal; I reviewed images myself and agree with interpretation.   01/26/13 MRI cervical - mild spondylitic  changes at C4-5 and C5-6 but without significant compression. No enhancing lesions are noted; I reviewed images myself and agree with interpretation.  01/26/13 DAT scan  - virtually absent uptake bilaterally affecting both putamen and caudate nuclear. This pattern can be seen in Parkinson's disease or related symptoms.   ASSESSMENT AND PLAN  48 y.o. year old female  has a past medical history of Anemia and Shortness of breath. here with constellation of symptoms including transient visual distortion and headaches (likely migraine), and generalized fatigue, intermittent dizziness, and extrapyramidal signs and symptoms in the arms and legs (right greater than left).  Ddx: parkinson's disease (akinetic rigid) vs parkinson's plus syndrome (MSA or CBD)   PLAN: - continue carbidopa-levodopa 2 tabs TID - continue florinef re: orthostatic hypotension; also increase fluid intake, monitor vitals at home, raise head of bed with blocks and use compression stocking on legs; may consider northera (droxidopa) in future - velafaxine for depression - rollator walker prn - consider functional capacity evaluation for consideration of SSD application  Return in about 4 months (around 12/15/2013).    Penni Bombard, MD 05/12/1441, 1:54 PM Certified in Neurology, Neurophysiology and Neuroimaging  [facility] [address] [place], [place] [address] [phone]

## 2013-08-31 ENCOUNTER — Telehealth: Payer: Self-pay | Admitting: Diagnostic Neuroimaging

## 2013-08-31 NOTE — Telephone Encounter (Signed)
Kristin Gates w/[employer]ion [employer] (disability) called would like for Dr. Gladstone Lighter nurse to call him back concerning the return to work for pt. He has a letter written 07/06/13 stating pt can return to work on July 1st but wanted to confirm that there are no changes since her visit on 08/15/13. Also Elta [place] request a copy of the progress notes from pt's visit with Dr. Leta Baptist on 08/15/13 faxed to [phone] Attn: Kristin Gates. Thanks

## 2013-09-01 NOTE — Telephone Encounter (Signed)
I faxed to Harriet Pho at the number provided below.  I attempted to call pt and see how she is.   No answer.

## 2013-09-04 NOTE — Telephone Encounter (Addendum)
I called pt.  I asked her if she planned on going back to work on July 1?  She stated that she did.  (this will be 4 hours).  She will see how she does.  She asked if is not able to do this what did she need to do.  I relayed for her to touch base with HR, ( I was not sure about if this would be a new claim).  She said it would not be.  She is to let us know how she fares at work.   I did make f/u appt 12-04-13 at 1430.   Her insurance ends early October.  ? Functional capacity eval?  Pt states is now using a rollator, and did apply for handicap placards.  I did call Harriet Pho and speak to him about pt.  He also relayed that he spoke to her today as well.

## 2013-09-06 ENCOUNTER — Telehealth: Payer: Self-pay | Admitting: Diagnostic Neuroimaging

## 2013-09-06 NOTE — Telephone Encounter (Signed)
I called pt.   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

## 2013-09-06 NOTE — Telephone Encounter (Signed)
I consulted Dr. Leta Baptist about below.   He states it is up to pt to decide if she can or cannot do work.  Pt had not seen new position.  I told her that she can try it and see if can handle it if not then touch base with HR, and may have to go out on disability again.  Have functional capacity eval? (I believe this is self pay).   Pt to let us know if needs anything else.

## 2013-09-06 NOTE — Telephone Encounter (Signed)
Patient calling and stating job need an additional letter stating what she's capable of doing.  Please return call  Asap.  [phone].

## 2013-09-07 ENCOUNTER — Telehealth: Payer: Self-pay | Admitting: Diagnostic Neuroimaging

## 2013-09-07 NOTE — Telephone Encounter (Signed)
[facility] March with [employer] @ [phone].  Calling to check status of form faxed over this mornging.  Questioning when patient can return back to work part time basis and what are her limitations.  Please call and advise.

## 2013-09-11 ENCOUNTER — Encounter: Payer: Self-pay | Admitting: *Deleted

## 2013-09-11 ENCOUNTER — Other Ambulatory Visit: Payer: Self-pay

## 2013-09-11 DIAGNOSIS — Z1231 Encounter for screening mammogram for malignant neoplasm of breast: Secondary | ICD-10-CM

## 2013-09-11 NOTE — Telephone Encounter (Signed)
If patient not able to work, then she will need to file for disability. -VRP

## 2013-09-11 NOTE — Telephone Encounter (Signed)
Spoke to pt and she is not able to lift heavy loads (50lbs), stand for long periods, get up and down due to orthostatic hypotension.  Letter to Kristin Gates needed.  Back to work 09-18-13? Or not able to return due to diagnosis and progression of disease.

## 2013-09-13 ENCOUNTER — Ambulatory Visit
Admission: RE | Admit: 2013-09-13 | Discharge: 2013-09-13 | Disposition: A | Discharge status: Home / Self Care | Payer: BC Managed Care – PPO | Source: Ambulatory Visit

## 2013-09-13 DIAGNOSIS — Z1231 Encounter for screening mammogram for malignant neoplasm of breast: Secondary | ICD-10-CM

## 2013-09-20 ENCOUNTER — Telehealth: Payer: Self-pay | Admitting: Internal Medicine

## 2013-09-20 NOTE — Telephone Encounter (Signed)
[facility] PT. Received VM but is full an not able to accept new messages at this time.  WCB

## 2013-09-21 NOTE — Telephone Encounter (Signed)
Pt is interested in getting set-up on oxygen. I advised she will need an appt for this. Appt set for 09-25-13. [place], CMA

## 2013-09-25 ENCOUNTER — Encounter: Payer: Self-pay | Admitting: Internal Medicine

## 2013-09-25 ENCOUNTER — Ambulatory Visit (INDEPENDENT_AMBULATORY_CARE_PROVIDER_SITE_OTHER): Payer: BC Managed Care – PPO | Admitting: Internal Medicine

## 2013-09-25 VITALS — BP 120/72 | HR 87 | Temp 99.2°F | Ht 65.5 in | Wt 222.0 lb

## 2013-09-25 DIAGNOSIS — R0609 Other forms of dyspnea: Secondary | ICD-10-CM

## 2013-09-25 DIAGNOSIS — R0989 Other specified symptoms and signs involving the circulatory and respiratory systems: Secondary | ICD-10-CM

## 2013-09-25 DIAGNOSIS — R0600 Dyspnea, unspecified: Secondary | ICD-10-CM

## 2013-09-25 MED ORDER — FAMOTIDINE 20 MG PO TABS: ORAL_TABLET | ORAL | Status: AC

## 2013-09-25 MED ORDER — PANTOPRAZOLE SODIUM 40 MG PO TBEC: 40.0000 mg | DELAYED_RELEASE_TABLET | Freq: Every day | ORAL | Status: AC

## 2013-09-25 NOTE — Progress Notes (Signed)
Subjective:    Patient ID: Kristin Gates, female    DOB: August 15, 1965   MRN: [id number]   Brief patient profile:  80 yobf never smoked never respiratory problems until late Spring 2014 doe referred  to pulmonary clinic 01/12/2013 by Dr Bryon Lions for sob at rest and walking 162ft    History of Present Illness  01/12/2013 1st [facility] office visit/ Arva Slaugh cc indolent onset variable doe with steps or inclines assoc with weakness on R hand and R leg.  Not coughing at all. Could not do treadmill due to weakness on  R still undergoing neuro eval for this. rec Pantoprazole (protonix) 40 mg   Take 30-60 min before first meal of the day and Pepcid 20 mg one bedtime until return to office - this is the best way to tell whether stomach acid is contributing to your problem.  GERD diet   02/10/2013 f/u ov/Presly Steinruck re: unexplained sob ? Upper airway dysfunction. Chief Complaint  Patient presents with  . Follow-up    PFT done today.  Breathing is unchanged since last OV.  Since last ov no change in urge to clear throat  Did not use ppi/h2 hs as rec consistently  Neurology concerned re autonomic dysfunction with orthostasis s tachycardia Very poor appetite, not even drinking much "don't feel like it" - at ov late pm had not had anything at all po all day. rec  CTa > neg     09/25/2013 f/u ov/Emmaly Leech re: unexplained sob/ not on gerd rx  Chief Complaint  Patient presents with  . Acute Visit    Pt c/o increased SOB for the past several months. She gets SOB with or w/o any exertion.   bedroom is on second floor goes up s stopping but rests at top and this is new x 6 m but not really progressive Still doing shopping at [facility] but not superstore, plan to get sticker for  [facility] parking this week  Talking also causes same  problem even at rest     No obvious day to day or daytime variabilty or assoc chronic cough or cp or chest tightness, subjective wheeze overt sinus or hb symptoms. No unusual exp hx or  h/o childhood pna/ asthma or knowledge of premature birth.  Sleeping ok without nocturnal  or early am exacerbation  of respiratory  c/o's or need for noct saba. Also denies any obvious fluctuation of symptoms with weather or environmental changes or other aggravating or alleviating factors except as outlined above   Current Medications, Allergies, Complete Past Medical History, Past Surgical History, Family History, and Social History were reviewed in [employer] record.  ROS  The following are not active complaints unless bolded sore throat, dysphagia, dental problems, itching, sneezing,  nasal congestion or excess/ purulent secretions, ear ache,   fever, chills, sweats, unintended wt loss, pleuritic or exertional cp, hemoptysis,  orthopnea pnd or leg swelling, presyncope, palpitations, heartburn, abdominal pain, anorexia, nausea, vomiting, diarrhea  or change in bowel or urinary habits, change in stools or urine, dysuria,hematuria,  rash, arthralgias, visual complaints, headache, numbness weakness or ataxia or problems with walking or coordination,  change in mood/affect or memory.              Objective:   Physical Exam   02/10/2013       227 > 09/25/2013 222  Wt Readings from Last 3 Encounters:  01/12/13 239 lb 3.2 oz (108.5 kg)  01/11/13 237 lb 8 oz (107.729  kg)  11/28/12 244 lb (110.678 kg)     amb bf nad with no longer any prominent pseudowheezes but affect very unusual ? Belle indifference   HEENT: nl dentition, turbinates, and orophanx. Nl external ear canals without cough reflex   NECK :  without JVD/Nodes/TM/ nl carotid upstrokes bilaterally   LUNGS: no acc muscle use, clear to A and P bilaterally without cough on insp or exp maneuvers   CV:  RRR  no s3 or murmur or increase in P2, no edema   ABD:  Obese soft and nontender with nl excursion in the supine position. No bruits or organomegaly, bowel sounds nl  MS:  warm without deformities, calf  tenderness, cyanosis or clubbing  SKIN: warm and dry without lesions    NEURO:  alert, approp, no deficits       01/12/13 Labs Nl d dimer, nl cbc, bmet s evidence dehydration    Assessment & Plan:

## 2013-09-25 NOTE — Patient Instructions (Addendum)
Pantoprazole (protonix) 40 mg   Take 30-60 min before first meal of the day and Pepcid 20 mg one bedtime until return to office - this is the best way to tell whether stomach acid is contributing to your problem.   GERD (REFLUX)  is an extremely common cause of respiratory symptoms, many times with no significant heartburn at all.    It can be treated with medication, but also with lifestyle changes including avoidance of late meals, excessive alcohol, smoking cessation, and avoid fatty foods, chocolate, peppermint, colas, red wine, and acidic juices such as orange juice.  NO MINT OR MENTHOL PRODUCTS SO NO COUGH DROPS  USE SUGARLESS CANDY INSTEAD (jolley ranchers or Stover's)  NO OIL BASED VITAMINS - use powdered substitutes.  If not getting better, call [place] [phone] to the voice center at [facility] Dr Joya Gaskins

## 2013-09-26 NOTE — Assessment & Plan Note (Addendum)
-   01/12/2013  Walked RA x 3 laps @ 185 ft each stopped due to  End of study, sats 87% at the very end s sign sob - 02/10/2013 PFT's wnl - 02/10/2013  Walked RA x 3 laps @ 185 ft each stopped due to desat to 84% with P 117  - CTa  02/13/2013 > Negative for pulmonary embolus. Negative examination. - 09/25/2013  Walked RA x 3 laps @ 185 ft each stopped due to  No desat, no sob  Symptoms are markedly disproportionate to objective findings and not clear this is a lung problem but pt does appear to have difficult airway management issues. DDX of  difficult airways management all start with A and  include Adherence, Ace Inhibitors, Acid Reflux, Active Sinus Disease, Alpha 1 Antitripsin deficiency, Anxiety masquerading as Airways dz,  ABPA,  allergy(esp in young), Aspiration (esp in elderly), Adverse effects of DPI,  Active smokers, plus two Bs  = Bronchiectasis and Beta blocker use..and one C= CHF   ? Acid (or non-acid) GERD > always difficult to exclude as up to 75% of pts in some series report no assoc GI/ Heartburn symptoms> rec max (24h)  acid suppression and diet restrictions/ reviewed and instructions given in writing.   ? Anxiety/ ? vcd related to Parkinson's > if not response to max gerd rx rec speech center eval/ best choice Joya Gaskins at [facility] where she's already established.

## 2013-10-12 DIAGNOSIS — Z0271 Encounter for disability determination: Secondary | ICD-10-CM

## 2013-11-16 DIAGNOSIS — Z0289 Encounter for other administrative examinations: Secondary | ICD-10-CM

## 2013-12-04 ENCOUNTER — Ambulatory Visit: Payer: Self-pay | Admitting: Diagnostic Neuroimaging

## 2013-12-21 ENCOUNTER — Telehealth: Payer: Self-pay | Admitting: *Deleted

## 2013-12-21 NOTE — Telephone Encounter (Signed)
[place] [employer] [place] [employer] sent to [place] 12-21-13

## 2013-12-21 NOTE — Telephone Encounter (Addendum)
I received Forms from Medical Records and I have placed them in the Providers file and have placed them on his assistants In-Box to be completed. There are two forms

## 2013-12-22 DIAGNOSIS — Z0289 Encounter for other administrative examinations: Secondary | ICD-10-CM

## 2013-12-26 NOTE — Telephone Encounter (Signed)
Spoke to patient about forms. Patient states she is unable to perform job. She requested once forms faxed to have originals mailed to address on file. Placed forms in Dr. Gladstone Lighter inbox.

## 2014-01-08 DIAGNOSIS — Z0289 Encounter for other administrative examinations: Secondary | ICD-10-CM

## 2014-01-16 ENCOUNTER — Telehealth: Payer: Self-pay | Admitting: *Deleted

## 2014-01-16 NOTE — Telephone Encounter (Signed)
Forms given back to [facility].

## 2014-01-16 NOTE — Telephone Encounter (Signed)
[place] and [place] completed by Dr Leta Baptist faxed and sent patient a copy 12-17-13.

## 2014-01-30 ENCOUNTER — Ambulatory Visit: Payer: BC Managed Care – PPO | Admitting: Physical Therapy

## 2014-01-30 ENCOUNTER — Ambulatory Visit: Payer: BC Managed Care – PPO | Admitting: Occupational Therapy

## 2014-01-30 ENCOUNTER — Ambulatory Visit: Payer: BC Managed Care – PPO

## 2014-03-15 ENCOUNTER — Telehealth: Payer: Self-pay

## 2014-03-15 NOTE — Telephone Encounter (Signed)
Form, [employer] correction made by Aron Baba faxed 03-15-14.

## 2014-03-15 NOTE — Telephone Encounter (Signed)
Called patient with questions about disability forms. No answer, vmail full.

## 2014-03-15 NOTE — Telephone Encounter (Signed)
Forms completed and were taken to our medical record department for processing. 

## 2014-03-15 NOTE — Telephone Encounter (Signed)
Forms completed and were taken to our medical record department for processing.  I attempted to call the patient to let her know that her form was ready but her voicemail was full.

## 2014-06-20 IMAGING — CT CT ANGIO CHEST
3 of 7 series · 19 of 36 positions shown · IV contrast (Omnipaque 300)
Comparison: PA and lateral chest 08/10/2012.

CLINICAL DATA: Dyspnea. Decreased oxygen saturation. Right side
weakness.

EXAM:
CT ANGIOGRAPHY CHEST WITH CONTRAST
TECHNIQUE: Multidetector CT imaging of the chest was performed using the
standard protocol during bolus administration of intravenous
contrast. Multiplanar CT image reconstructions including MIPs were
obtained to evaluate the vascular anatomy.
CONTRAST:  80mL OMNIPAQUE IOHEXOL 350 MG/ML SOLN

[Series 6: thins 1.0mm / 0.8mm · axial · 0.75mm/px · z∈[-221,-12]mm · 15 of 239 slices shown]
[im 15/239  lung]
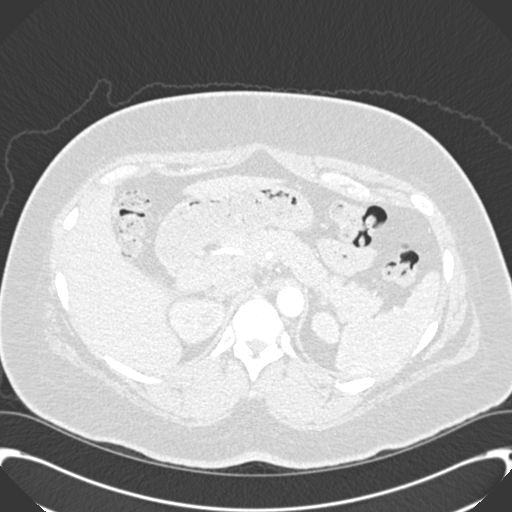
[im 30/239  mediastinal]
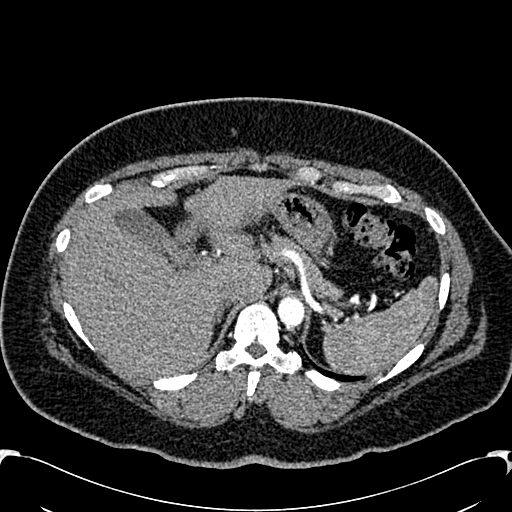
[im 45/239  lung]
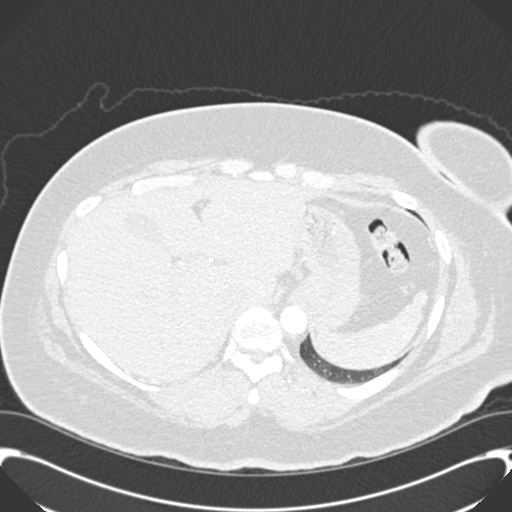
[im 60/239  mediastinal]
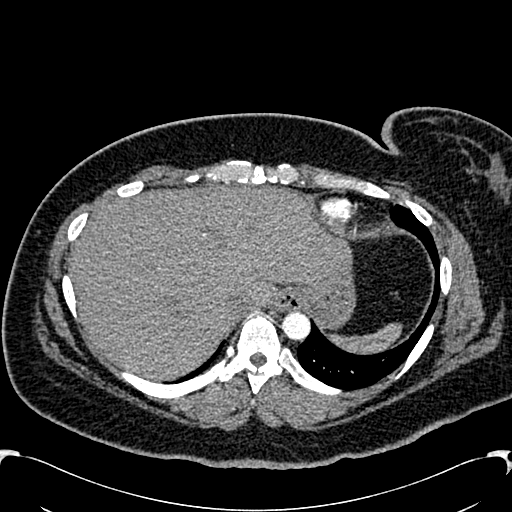
[im 75/239  lung]
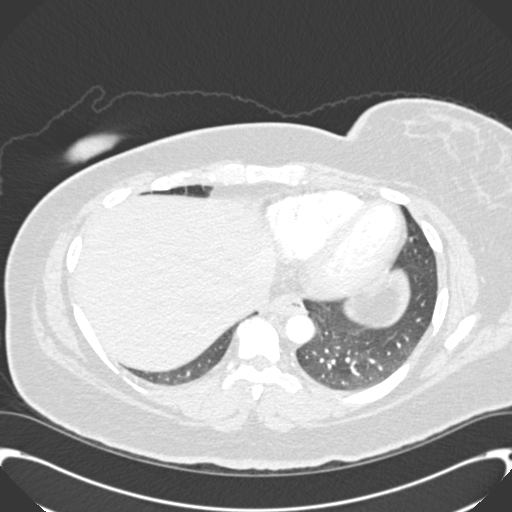
[im 90/239  mediastinal]
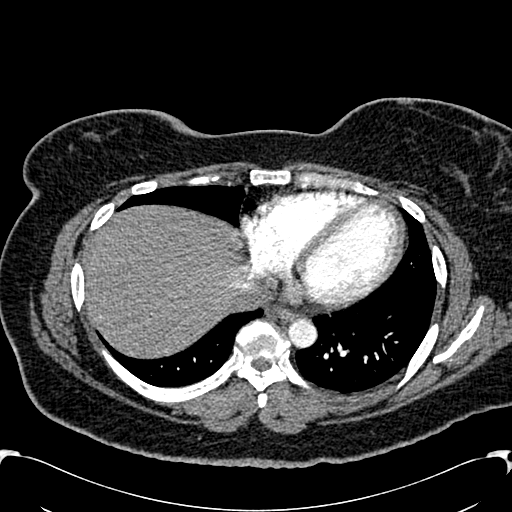
[im 105/239  lung]
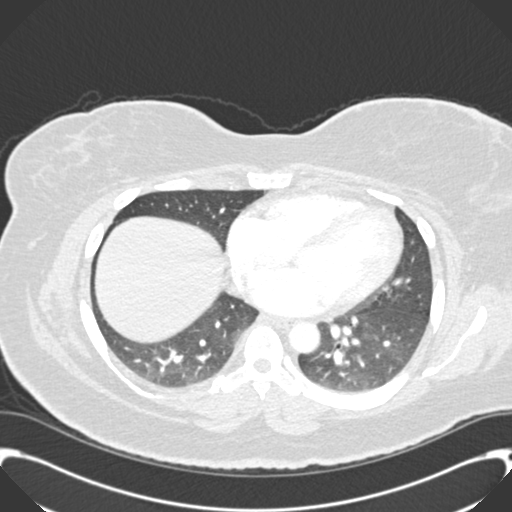
[im 120/239  mediastinal]
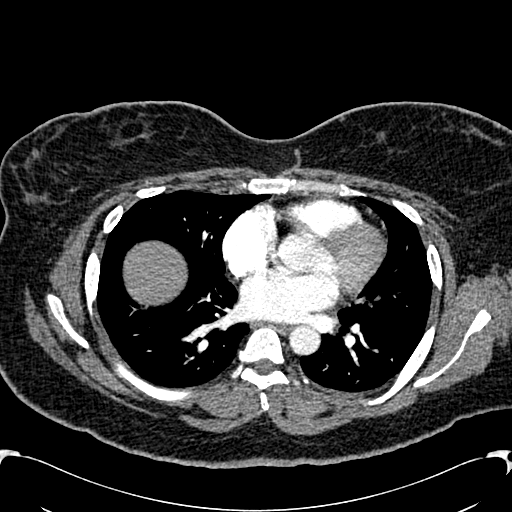
[im 134/239  lung]
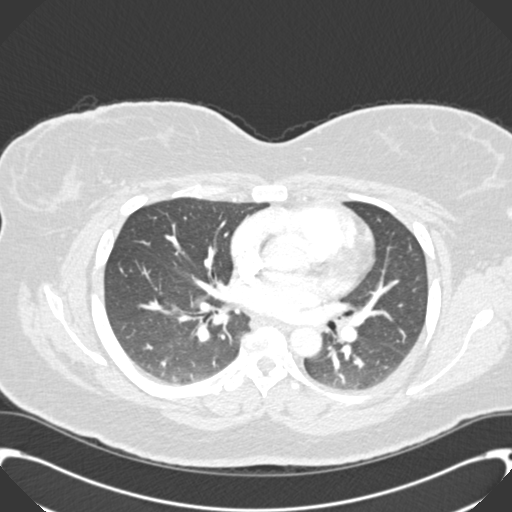
[im 149/239  mediastinal]
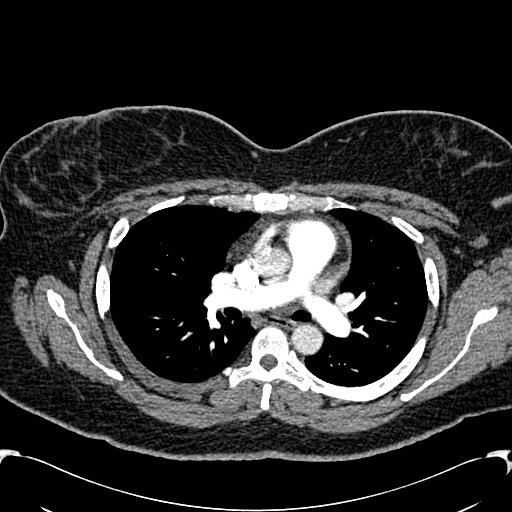
[im 164/239  lung]
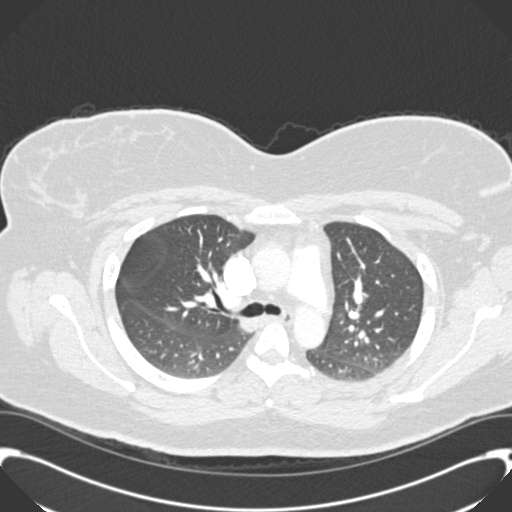
[im 179/239  mediastinal]
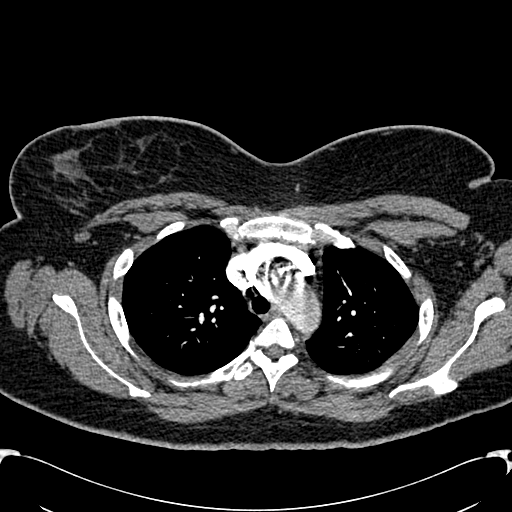
[im 194/239  lung]
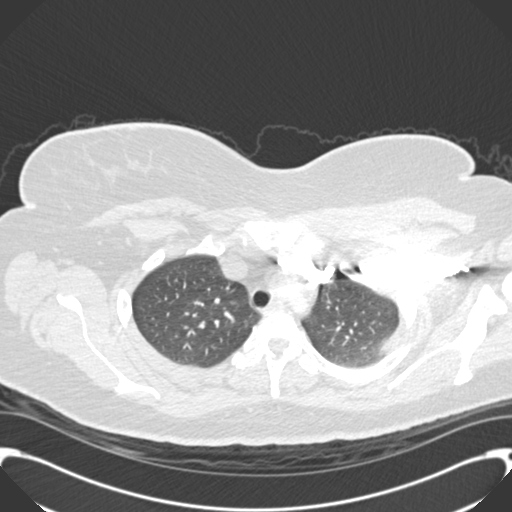
[im 209/239  mediastinal]
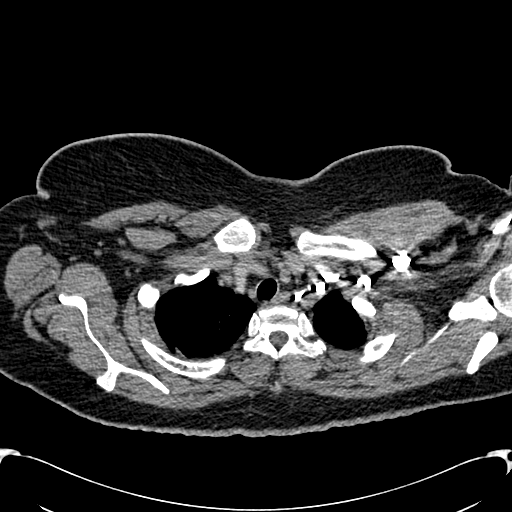
[im 224/239  lung]
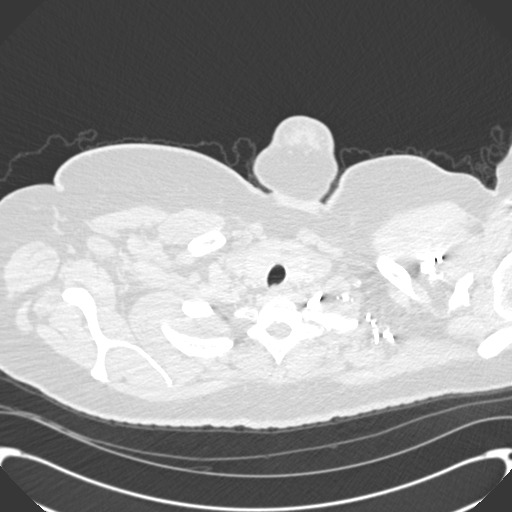

[Series 7: lung 3.0mm / 3.0mm · axial · 0.75mm/px · z∈[-187,-94]mm · 3 of 78 slices shown]
[im 16/78  mediastinal]
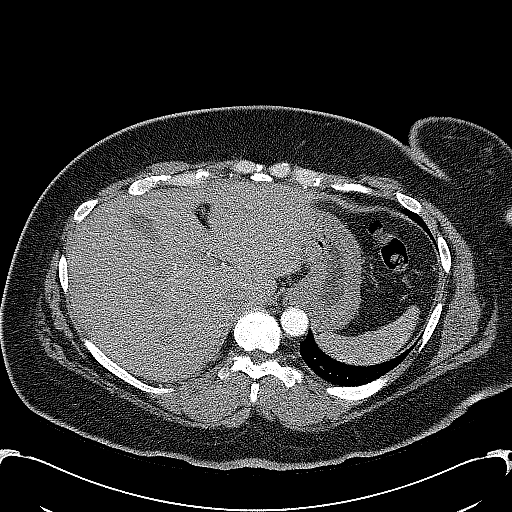
[im 31/78  mediastinal]
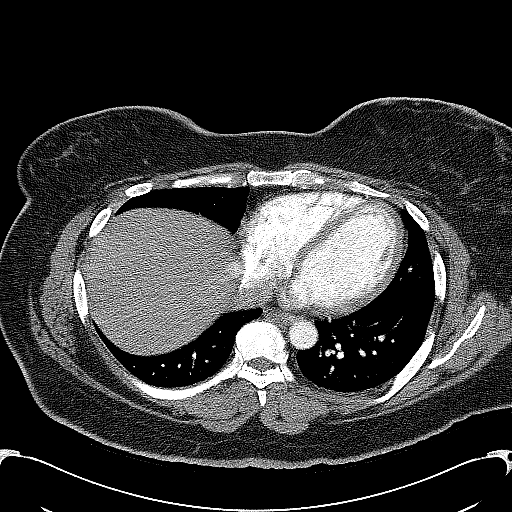
[im 47/78  mediastinal]
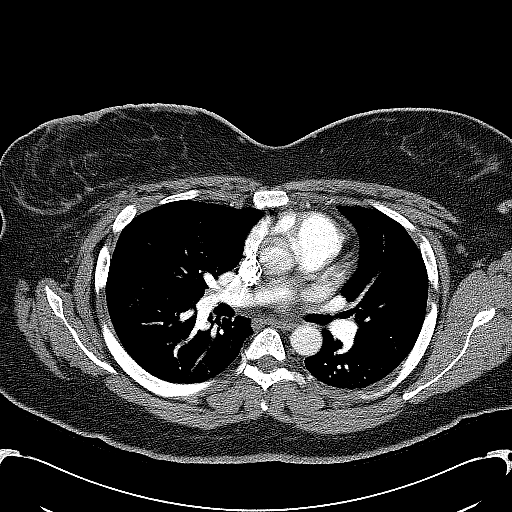

[Series 602: cor mpr · coronal · 0.75mm/px · 1 of 87 slices shown]
[im 44/87  mediastinal]
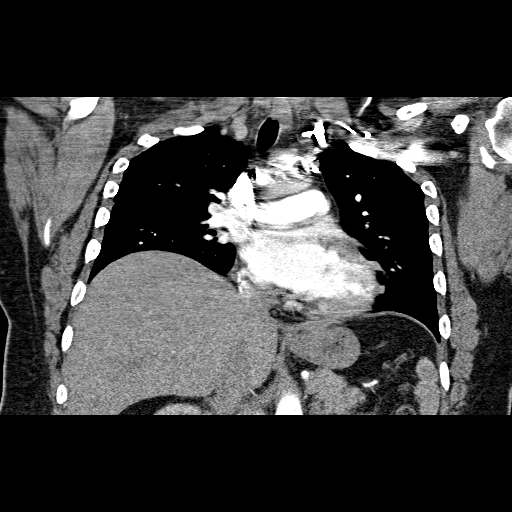

[19 of 36 positions shown; findings below may reference images not displayed]

FINDINGS: No pulmonary embolus is identified. Heart size is normal. There is
no axillary, hilar or mediastinal lymphadenopathy. No pleural or
pericardial effusion. The lungs demonstrate only mild dependent
atelectatic change. Visualized intra-abdominal contents are
unremarkable. No focal bony abnormality is identified.

Review of the MIP images confirms the above findings.
IMPRESSION: Negative for pulmonary embolus.  Negative examination.

## 2014-07-18 ENCOUNTER — Other Ambulatory Visit: Payer: Self-pay | Admitting: Internal Medicine

## 2015-09-24 ENCOUNTER — Telehealth: Payer: Self-pay | Admitting: Diagnostic Neuroimaging

## 2015-09-24 NOTE — Telephone Encounter (Signed)
Patient's sister Carmel [place] is calling and states she needs confirmation of her sister's diagnosis of parkinson with date mailed to: [place], [place], [place], [place], [place]. Sister stated they have a fax but they say it doesn't always work(fax [phone]).

## 2015-09-24 NOTE — Telephone Encounter (Signed)
Requested documentation faxed to [place]. In addition, a copy will be mailed.

## 2015-10-08 ENCOUNTER — Encounter (INDEPENDENT_AMBULATORY_CARE_PROVIDER_SITE_OTHER): Payer: Self-pay | Admitting: Neurology

## 2015-10-08 ENCOUNTER — Encounter (INDEPENDENT_AMBULATORY_CARE_PROVIDER_SITE_OTHER): Payer: Self-pay

## 2015-10-08 ENCOUNTER — Ambulatory Visit (INDEPENDENT_AMBULATORY_CARE_PROVIDER_SITE_OTHER): Payer: Medicare Other | Admitting: Neurology

## 2015-10-08 VITALS — BP 126/85 | HR 92 | Ht 65.5 in | Wt 202.0 lb

## 2015-10-08 DIAGNOSIS — F329 Major depressive disorder, single episode, unspecified: Secondary | ICD-10-CM

## 2015-10-08 DIAGNOSIS — F419 Anxiety disorder, unspecified: Secondary | ICD-10-CM

## 2015-10-08 DIAGNOSIS — I951 Orthostatic hypotension: Secondary | ICD-10-CM

## 2015-10-08 DIAGNOSIS — G4752 REM sleep behavior disorder: Secondary | ICD-10-CM

## 2015-10-08 DIAGNOSIS — G20 Parkinson's disease: Secondary | ICD-10-CM

## 2015-10-08 MED ORDER — CITALOPRAM HYDROBROMIDE 10 MG PO TABS
10.0000 mg | ORAL_TABLET | Freq: Every day | ORAL | Status: DC
Start: 2015-10-08 — End: 2015-11-05

## 2015-10-08 MED ORDER — ROPINIROLE HCL ER 2 MG PO TB24
2.0000 mg | ORAL_TABLET | Freq: Every evening | ORAL | Status: DC
Start: 2015-10-08 — End: 2015-11-10

## 2015-10-21 NOTE — Progress Notes (Signed)
Subjective:       Patient ID: Connie Pham is a 50 y.o. female.    CC: Parkinson's disease    HPI    50 year old female presenting to neurology clinic for violation of Parkinson's disease.  She diagnosed in 2014.  She was having notable tremor and stiffness, more so in the right arm.  She is currently taking Sinemet 1.5 tabs 4 times daily.  Ropinirole 1 mg 3 times daily.  While these medicines help, she will occasionally have dyskinesias.  Dyskinesias are worse with stress.  She also has notable blood pressure fluctuations, but it led to falls.  For example, there was a fall last month when she was initially standing and seemed unresponsive to verbal stimuli before the fall.  She is taking medication 5 mg, half tab 3 times daily.  And Florinef twice daily.  She is not sure if the dizziness and blood pressure fluctuations are worsened by her current medications.  She also has notable depression and anxiety, but is not taking any medication for them.  She has a good memory, no hallucinations.  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  Wakes frequently.  She has never fallen out of bed.  Denies any major problem with constipation.    Of note, she deposited DAT scan in 2015 that showed reduced dopaminergic transmission.    The following portions of the patient's history were reviewed and updated as appropriate: allergies, current medications, past family history, past medical history, past social history, past surgical history and problem list.    Review of Systems  Per HPI.  No recent illness.  Denies fever, chills, cough, sinus pain, eye pain, eye redness, ear pain, rhinorrhea, sore throat, chest pain, SOB, wheezing, abd pain, Nausea, Vomiting, diarrhea, dysuria, or rashes.  All other ROS negative.         Objective:    Physical Exam  BP 126/85 mmHg  Pulse 92  Ht 1.664 m (5' 5.5")  Wt 91.627 kg (202 lb)  BMI 33.09 kg/m2  LMP 10/02/2015 (Approximate)    Constitutional: NAD   Eyes: Clear  conjunctiva  Neck: Nontender, full ROM, No Lhermitte's sign  Cardiovascular: RRR  Resipratory: CTA B   Musculoskeletal: Normal muscle bulk.  No muscle pain  Integumentary: No abnormal rash noted  Psych: Normal affect    Neurological:   Mental Status: Alert & oriented to person, place, month, & year.   Language: Spontaneous & fluent speech, good comprehension.    Cranial Nerves:  II, III: PERRL, VFF  III, IV, VI: EOMI, No nystagmus, no palsies, no ptosis  V: Intact to LT V1-V3 distribution bilaterally.   VI: Symmetrical face and expression. Mild hypomimia  VIII: Hearing intact to finger rub bilaterally.   IX, X: Palate/Uvula elevates symmetrically.   XI: 5/5 Trapezius & SCM bilaterally.   XII: Tongue is midline.     Motor Exam: Normal bulk. No clonus. No pronator drift. Strength 5/5 throughout and symmetric.Increased tone in all extremities, more so on right arm, right leg.  Decreased fine motor movement in right arm, mild intermittent resting tremor in right arm.    Sensation: Sensation to LT intact grossly throughout symmetrically. Romberg negative    Cerebellar:   Finger to Nose: intact bilaterally w/ no dysmetria or tremor    Reflexes: 2+ throughout, symmetric, with down-going toes.    Station/ Gait: Well balanced and stable. Normal foot width. Able to perform heels, toes, and tandem gait. Normal arm swing.  UPDRS-16, no dyskinesias currently        Assessment:       Patient was diagnosed Parkinson's disease in 2014, DAT scan +, currently with a moderate degree of parkinsonian symptoms, but already with some motor fluctuations, and notable nonmotor symptoms of RBD, insomnia, anxiety, depression, and apparent orthostatic hypotension.  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  However, she does reportedly get benefit from parkinsonian medications, and the fluctuations in blood pressure could also be a side effect from the combination of  Sinemet and Requip.  We will try to change Requip to an extended release at bedtime to hopefully alleviate some of the blood pressure fluctuations during the day.  It also likely benefit from adding a medication such as Celexa to help with depression and anxiety.      Plan:      Procedures    1. Parkinson's disease-chronic, severe condition  Continue Sinemet 1.5 tabs 4 times daily  Change Requip to Requip XL 2 mg nightly  if blood pressure does not improve, may need to cut back on dopamine agonist.  In general    2. depression/anxiety  Start Celexa 10 mg daily    3.  Orthostatic hypotension  For now,we will continue Midodrin and Florinef  Hopefully will improve with improving dopamine medications  Could consider adding Northera    4. rBD  Can try melatonin  If it worsens, may need to add clonazepam    More than 50% of this 60 min office visit was spent counseling the patient on one or more of the following:   Disease state - discussion about the medical condition, diagnostic and treatment options   Medications - indications and side effects   Lifestyle issues as appropriate.

## 2015-10-22 ENCOUNTER — Other Ambulatory Visit (INDEPENDENT_AMBULATORY_CARE_PROVIDER_SITE_OTHER): Payer: Self-pay

## 2015-10-24 ENCOUNTER — Ambulatory Visit (INDEPENDENT_AMBULATORY_CARE_PROVIDER_SITE_OTHER): Payer: Medicare Other | Admitting: Neurology

## 2015-11-05 ENCOUNTER — Other Ambulatory Visit (INDEPENDENT_AMBULATORY_CARE_PROVIDER_SITE_OTHER): Payer: Self-pay

## 2015-11-05 DIAGNOSIS — G20 Parkinson's disease: Secondary | ICD-10-CM

## 2015-11-05 DIAGNOSIS — F32A Depression, unspecified: Secondary | ICD-10-CM

## 2015-11-05 MED ORDER — CARBIDOPA-LEVODOPA 25-100 MG PO TABS
1.0000 | ORAL_TABLET | Freq: Three times a day (TID) | ORAL | Status: DC
Start: 2015-11-05 — End: 2015-11-12

## 2015-11-05 MED ORDER — CITALOPRAM HYDROBROMIDE 10 MG PO TABS
10.0000 mg | ORAL_TABLET | Freq: Every day | ORAL | Status: DC
Start: 2015-11-05 — End: 2015-11-12

## 2015-11-05 MED ORDER — ROPINIROLE HCL ER 2 MG PO TB24
2.0000 mg | ORAL_TABLET | Freq: Every evening | ORAL | Status: DC
Start: 2015-11-05 — End: 2015-11-12

## 2015-11-10 ENCOUNTER — Other Ambulatory Visit (INDEPENDENT_AMBULATORY_CARE_PROVIDER_SITE_OTHER): Payer: Self-pay | Admitting: Neurology

## 2015-11-12 ENCOUNTER — Other Ambulatory Visit (INDEPENDENT_AMBULATORY_CARE_PROVIDER_SITE_OTHER): Payer: Self-pay

## 2015-11-12 DIAGNOSIS — G20 Parkinson's disease: Secondary | ICD-10-CM

## 2015-11-12 DIAGNOSIS — F419 Anxiety disorder, unspecified: Secondary | ICD-10-CM

## 2015-11-12 MED ORDER — CITALOPRAM HYDROBROMIDE 10 MG PO TABS
10.0000 mg | ORAL_TABLET | Freq: Every day | ORAL | Status: DC
Start: 2015-11-12 — End: 2015-11-20

## 2015-11-12 MED ORDER — ROPINIROLE HCL ER 2 MG PO TB24
2.0000 mg | ORAL_TABLET | Freq: Every evening | ORAL | Status: DC
Start: 2015-11-12 — End: 2015-11-21

## 2015-11-12 MED ORDER — CARBIDOPA-LEVODOPA 25-100 MG PO TABS
1.0000 | ORAL_TABLET | Freq: Three times a day (TID) | ORAL | Status: DC
Start: 2015-11-12 — End: 2015-11-19

## 2015-11-13 ENCOUNTER — Encounter (INDEPENDENT_AMBULATORY_CARE_PROVIDER_SITE_OTHER): Payer: Self-pay

## 2015-11-13 DIAGNOSIS — G20 Parkinson's disease: Secondary | ICD-10-CM | POA: Insufficient documentation

## 2015-11-19 ENCOUNTER — Other Ambulatory Visit (INDEPENDENT_AMBULATORY_CARE_PROVIDER_SITE_OTHER): Payer: Self-pay

## 2015-11-20 ENCOUNTER — Other Ambulatory Visit (INDEPENDENT_AMBULATORY_CARE_PROVIDER_SITE_OTHER): Payer: Self-pay

## 2015-11-20 DIAGNOSIS — G20 Parkinson's disease: Secondary | ICD-10-CM

## 2015-11-20 DIAGNOSIS — F32A Depression, unspecified: Secondary | ICD-10-CM

## 2015-11-20 MED ORDER — CITALOPRAM HYDROBROMIDE 10 MG PO TABS
10.0000 mg | ORAL_TABLET | Freq: Every day | ORAL | 2 refills | Status: DC
Start: 2015-11-20 — End: 2015-11-21

## 2015-11-20 MED ORDER — CARBIDOPA-LEVODOPA 25-100 MG PO TABS
1.0000 | ORAL_TABLET | Freq: Three times a day (TID) | ORAL | 2 refills | Status: DC
Start: 2015-11-20 — End: 2015-11-20

## 2015-11-20 MED ORDER — CITALOPRAM HYDROBROMIDE 10 MG PO TABS
10.0000 mg | ORAL_TABLET | Freq: Every day | ORAL | 2 refills | Status: DC
Start: 2015-11-20 — End: 2015-11-20

## 2015-11-20 MED ORDER — CARBIDOPA-LEVODOPA 25-100 MG PO TABS
1.0000 | ORAL_TABLET | Freq: Three times a day (TID) | ORAL | 2 refills | Status: DC
Start: 2015-11-20 — End: 2015-11-21

## 2015-11-20 MED ORDER — CARBIDOPA-LEVODOPA 25-100 MG PO TABS
1.0000 | ORAL_TABLET | Freq: Three times a day (TID) | ORAL | 1 refills | Status: DC
Start: 2015-11-20 — End: 2015-11-20

## 2015-11-21 ENCOUNTER — Encounter (INDEPENDENT_AMBULATORY_CARE_PROVIDER_SITE_OTHER): Payer: Self-pay | Admitting: Neurology

## 2015-11-21 ENCOUNTER — Ambulatory Visit (INDEPENDENT_AMBULATORY_CARE_PROVIDER_SITE_OTHER): Payer: Medicare Other | Admitting: Neurology

## 2015-11-21 VITALS — BP 103/71 | HR 90 | Ht 65.5 in

## 2015-11-21 DIAGNOSIS — G20 Parkinson's disease: Secondary | ICD-10-CM

## 2015-11-21 DIAGNOSIS — F329 Major depressive disorder, single episode, unspecified: Secondary | ICD-10-CM

## 2015-11-21 DIAGNOSIS — I951 Orthostatic hypotension: Secondary | ICD-10-CM

## 2015-11-21 DIAGNOSIS — G4752 REM sleep behavior disorder: Secondary | ICD-10-CM

## 2015-11-21 DIAGNOSIS — F419 Anxiety disorder, unspecified: Secondary | ICD-10-CM

## 2015-11-21 MED ORDER — CARBIDOPA-LEVODOPA 25-100 MG PO TABS
1.5000 | ORAL_TABLET | Freq: Three times a day (TID) | ORAL | 3 refills | Status: DC
Start: 2015-11-21 — End: 2015-12-04

## 2015-11-21 MED ORDER — CITALOPRAM HYDROBROMIDE 10 MG PO TABS
10.0000 mg | ORAL_TABLET | Freq: Every day | ORAL | 2 refills | Status: DC
Start: 2015-11-21 — End: 2016-05-14

## 2015-11-21 MED ORDER — ROPINIROLE HCL ER 6 MG PO TB24
6.0000 mg | ORAL_TABLET | Freq: Every evening | ORAL | 3 refills | Status: DC
Start: 2015-11-21 — End: 2016-03-05

## 2015-11-21 NOTE — Patient Instructions (Signed)
Increase requip (ropinerole xl) to 6 mg nightly  Continue sinemet 1.5 tabs three times daily.  If in 2 weeks, you are still feeling stiff and shaky, then increase the sinemet to 2 tabs 3 times daily.  Please increase it gradually, by adjusting one dose at a time.   For example, start 2 tabs in the morning, and 1.5 tabs with the next 2 doses.  Do this for at least 3-4 days, then if needed, increase 2 of the doses to 2 tabs during the day, and so on.

## 2015-11-21 NOTE — Progress Notes (Signed)
Subjective:       Patient ID: Connie Pham is a 50 y.o. female.    CC: Parkinson's disease    HPI      50 year old female presenting to neurology clinic for f/u of Parkinson's disease. She is now taking Requip XL 4 mg nightly, in addition to the Sinemet 1.5 tabs 3 times daily.  She still feeling somewhat stiff and shaky.,  She is not having the dyskinesias as badly.  Still very unsteady when walking, but no recent falls.  However, she is not trying to walk or getting much exercise.  Her sister is concerned that she is not trying to get any activity, though she knows it is necessary.    She still gets very dizzy on standing, perhaps not as bad recently.  However, she has not been trying to walk as much.  She does think the dizziness is better with a longer acting Requip as opposed to the immediate release during the day    No memory decline.  She is does have some anxiety, especially in concern for falling.    She is still waking often overnight, but no RBD.    No constipation.    Of note, she had a DAT scan in 2015 that showed reduced dopaminergic transmission.    The following portions of the patient's history were reviewed and updated as appropriate: allergies, current medications, past family history, past medical history, past social history, past surgical history and problem list.    Review of Systems    Per HPI.  No recent illness.  Denies fever, chills, cough, sinus pain, eye pain, eye redness, ear pain, rhinorrhea, sore throat, chest pain, SOB, wheezing, abd pain, Nausea, Vomiting, diarrhea, dysuria, or rashes.  All other ROS negative.         Objective:    Physical Exam    Ht 1.664 m (5' 5.5")     Constitutional: NAD   Eyes: Clear conjunctiva  Neck: Nontender, full ROM, No Lhermitte's sign  Cardiovascular: RRR  Resipratory: CTA B   Musculoskeletal: Normal muscle bulk.  No muscle pain  Integumentary: No abnormal rash noted  Psych: Normal affect    Neurological:   Mental Status: Alert & oriented to person,  place, month, & year.   Language: Spontaneous & fluent speech, good comprehension.    Cranial Nerves:  II, III: PERRL, VFF  III, IV, VI: EOMI, No nystagmus, no palsies, no ptosis  V: Intact to LT V1-V3 distribution bilaterally.   VI: Symmetrical face and expression. Mild hypomimia  VIII: Hearing intact to finger rub bilaterally.   IX, X: Palate/Uvula elevates symmetrically.   XI: 5/5 Trapezius & SCM bilaterally.   XII: Tongue is midline.     Motor Exam: Normal bulk. No clonus. No pronator drift. Strength 5/5 throughout and symmetric.Increased tone in all extremities, more so on right arm, right leg.  Decreased fine motor movement in right arm, mild intermittent resting tremor in right arm.    Sensation: Sensation to LT intact grossly throughout symmetrically. Romberg negative    Cerebellar:   Finger to Nose: intact bilaterally w/ no dysmetria or tremor    Reflexes: 2+ throughout, symmetric, with down-going toes.    Station/ Gait: Well balanced and stable. Normal foot width. Able to perform heels, toes, and tandem gait. Normal arm swing.    UPDRS-16, no dyskinesias currently        Assessment:       Patient was diagnosed Parkinson's disease in 2014, DAT scan +,  currently with a moderate degree of parkinsonian symptoms, but already with some motor fluctuations, and notable nonmotor symptoms of RBD, insomnia, anxiety, depression, and apparent orthostatic hypotension.        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   However, she does get benefit from parkinsonian medications, any change to Requip XL may have improved to the hypotension.    We will gradually increase Requip XL and possibly adjust up the Sinemet to see if we can get further movement improvement without further drop in blood pressure.         However, she does reportedly get benefit from parkinsonian medications, and the fluctuations in blood pressure could  also be a side effect from the combination of Sinemet and Requip.  We will try to change Requip to an extended release at bedtime to hopefully alleviate some of the blood pressure fluctuations during the day.  It also likely benefit from adding a medication such as Celexa to help with depression and anxiety.      Plan:      Procedures      1. Parkinson's disease-chronic, severe condition  Continue Sinemet 1.5 tabs 4 times daily  Better with Requip XL,  increase to 6 mg nightly.  if blood pressure does not improve, may need to cut back on dopamine agonist.  In general    2. depression/anxiety  cont Celexa 10 mg daily    3.  Orthostatic hypotension  For now,we will continue Midodrin and Florinef  Hopefully will improve with improving dopamine medications  Could consider adding Northera    4. rBD  Can try melatonin  If it worsens, may need to add clonazepam    Kateri Plummer, MD, PhD  [facility] Neurology

## 2015-12-02 ENCOUNTER — Encounter (INDEPENDENT_AMBULATORY_CARE_PROVIDER_SITE_OTHER): Payer: Self-pay | Admitting: Neurology

## 2015-12-04 ENCOUNTER — Other Ambulatory Visit (INDEPENDENT_AMBULATORY_CARE_PROVIDER_SITE_OTHER): Payer: Self-pay

## 2015-12-04 DIAGNOSIS — G20 Parkinson's disease: Secondary | ICD-10-CM

## 2015-12-04 MED ORDER — CARBIDOPA-LEVODOPA 25-100 MG PO TABS
1.5000 | ORAL_TABLET | Freq: Four times a day (QID) | ORAL | 2 refills | Status: DC
Start: 2015-12-04 — End: 2016-02-06

## 2015-12-26 ENCOUNTER — Ambulatory Visit (INDEPENDENT_AMBULATORY_CARE_PROVIDER_SITE_OTHER): Payer: Medicare Other | Admitting: Neurology

## 2016-01-13 ENCOUNTER — Other Ambulatory Visit (INDEPENDENT_AMBULATORY_CARE_PROVIDER_SITE_OTHER): Payer: Self-pay

## 2016-01-13 MED ORDER — ROPINIROLE HCL ER 2 MG PO TB24
4.0000 mg | ORAL_TABLET | Freq: Every evening | ORAL | 3 refills | Status: DC
Start: 2016-01-13 — End: 2016-02-12

## 2016-02-06 ENCOUNTER — Other Ambulatory Visit (INDEPENDENT_AMBULATORY_CARE_PROVIDER_SITE_OTHER): Payer: Self-pay

## 2016-02-06 DIAGNOSIS — G20 Parkinson's disease: Secondary | ICD-10-CM

## 2016-02-06 MED ORDER — CARBIDOPA-LEVODOPA 25-100 MG PO TABS
1.5000 | ORAL_TABLET | Freq: Four times a day (QID) | ORAL | 0 refills | Status: DC
Start: 2016-02-06 — End: 2016-02-19

## 2016-02-12 ENCOUNTER — Other Ambulatory Visit (INDEPENDENT_AMBULATORY_CARE_PROVIDER_SITE_OTHER): Payer: Self-pay

## 2016-02-12 MED ORDER — ROPINIROLE HCL ER 2 MG PO TB24
4.0000 mg | ORAL_TABLET | Freq: Every evening | ORAL | 3 refills | Status: DC
Start: 2016-02-12 — End: 2016-03-11

## 2016-02-19 ENCOUNTER — Other Ambulatory Visit (INDEPENDENT_AMBULATORY_CARE_PROVIDER_SITE_OTHER): Payer: Self-pay

## 2016-02-19 DIAGNOSIS — G20 Parkinson's disease: Secondary | ICD-10-CM

## 2016-02-19 MED ORDER — CARBIDOPA-LEVODOPA 25-100 MG PO TABS
2.0000 | ORAL_TABLET | Freq: Four times a day (QID) | ORAL | 2 refills | Status: DC
Start: 2016-02-19 — End: 2016-03-05

## 2016-03-05 ENCOUNTER — Ambulatory Visit (INDEPENDENT_AMBULATORY_CARE_PROVIDER_SITE_OTHER): Payer: Medicare Other | Admitting: Neurology

## 2016-03-05 VITALS — BP 147/90 | HR 66 | Ht 60.5 in

## 2016-03-05 DIAGNOSIS — I951 Orthostatic hypotension: Secondary | ICD-10-CM

## 2016-03-05 DIAGNOSIS — G20 Parkinson's disease: Secondary | ICD-10-CM

## 2016-03-05 DIAGNOSIS — G4752 REM sleep behavior disorder: Secondary | ICD-10-CM

## 2016-03-05 DIAGNOSIS — R2681 Unsteadiness on feet: Secondary | ICD-10-CM

## 2016-03-05 DIAGNOSIS — F419 Anxiety disorder, unspecified: Secondary | ICD-10-CM

## 2016-03-05 MED ORDER — ROTIGOTINE 2 MG/24HR TD PT24
1.0000 | MEDICATED_PATCH | Freq: Every day | TRANSDERMAL | 2 refills | Status: DC
Start: 2016-03-05 — End: 2016-05-04

## 2016-03-05 MED ORDER — ROTIGOTINE 4 MG/24HR TD PT24
1.0000 | MEDICATED_PATCH | Freq: Every day | TRANSDERMAL | 3 refills | Status: AC
Start: 2016-03-05 — End: ?

## 2016-03-05 MED ORDER — CARBIDOPA-LEVODOPA 25-100 MG PO TABS
2.0000 | ORAL_TABLET | Freq: Every day | ORAL | 2 refills | Status: AC
Start: 2016-03-05 — End: ?

## 2016-03-05 NOTE — Progress Notes (Signed)
Subjective:       Patient ID: Connie Pham is a 50 y.o. female.    CC: Parkinson's disease              50 year old female presenting to neurology clinic for f/u of Parkinson's disease. She increased presenting to neurology clinic for f/u of Parkinson's disease. She increased her Sinemet to 2 tabs 4 times daily, and will occasionally take it overnight because she is very stiff when she wakes up.  She has not noticed any significant change in terms of her movement or stiffness with this medication increase.  She also tried to go up on the Requip XL to 6 mg nightly, but she experienced swelling in her legs.  She cut the Requip back to 4 mg nightly and the swelling improved.  She still has some swelling behind her right leg.  As with the Sinemet, she did not notice any improvement in her movements with the increase to 6.  No other side effects noted.    He feels very tired throughout the day and therefore gets very little activity.  She is a very hard time getting out of bed, or getting up out of the bathtub.  She is very worried about falls, very unsteady walking.  No recent falls.  Occasional dizziness on standing, but no sick.      No memory decline.  No confusion or hallucinations.    She is still waking often overnight, but no RBD.    No constipation.    Of note, she had a DAT scan in 2015 that showed reduced dopaminergic transmission.    The following portions of the patient's history were reviewed and updated as appropriate: allergies, current medications, past family history, past medical history, past social history, past surgical history and problem list.    Review of Systems  Per HPI.  No recent illness.  Denies fever, chills, cough, sinus pain, eye pain, eye redness, ear pain, rhinorrhea, sore throat, chest pain, SOB, wheezing, abd pain, Nausea, Vomiting, diarrhea, dysuria, or rashes.  All other ROS negative.         Objective:    Physical Exam  BP 147/90   Pulse 66   Ht 1.537 m (5' 0.5")     Constitutional: NAD   Eyes: Clear conjunctiva  Neck: Nontender, full ROM, No  Lhermitte's sign  Cardiovascular: RRR  Resipratory: CTA B   Musculoskeletal: Normal muscle bulk.  No muscle pain  Integumentary: No abnormal rash noted  Psych: Normal affect    Neurological:   Mental Status: Alert & oriented to person, place, month, & year.   Language: Spontaneous & fluent speech, good comprehension.    Cranial Nerves:  II, III: PERRL, VFF  III, IV, VI: Disconjugate at baseline, but EOMI, No nystagmus, no palsies, no ptosis.  Notable saccadic movements bilaterally.  V: Intact to LT V1-V3 distribution bilaterally.   VI: Symmetrical face and expression. Mild hypomimia  VIII: Hearing intact to finger rub bilaterally.   IX, X: Palate/Uvula elevates symmetrically.   XI: 5/5 Trapezius & SCM bilaterally.   XII: Tongue is midline.     Motor Exam: Normal bulk. No clonus. No pronator drift. Strength 5/5 throughout and symmetric.Increased tone in all extremities, more so on right arm, right leg.  Decreased fine motor movement in right arm, mild intermittent resting tremor in right arm.    Sensation: Sensation to LT intact grossly throughout symmetrically. Romberg negative    Cerebellar:   Finger to Nose: Mild intention tremor bilaterally, no dysmetria.    Reflexes: 2+ throughout, symmetric,  with down-going toes.    Station/ Gait: Arrives in a wheelchair, requires assistance to stand.    UPDRS-20, no dyskinesias currently        Assessment:       Patient was diagnosed Parkinson's disease in 2014, DAT scan +, currently with a moderate degree of parkinsonian symptoms, but already with some motor fluctuations, and notable nonmotor symptoms of RBD, insomnia, anxiety, depression, and apparent orthostatic hypotension.  Her motor score has certainly worsened and UPDRS is now 22.       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  This become more concerning, as she has progressed somewhat rapidly, with unclear benefit to  dopaminergic medications.    We will titrate off of Requip over the next 2 weeks, and we will then try Neupro patch to see if we can get better benefit without side effects.    She would also benefit from physical therapy.    As before, she would  also likely benefit from increasing Celexa to help with depression and anxiety.    Lastly, given the rapid progression get an MRI brain to see if there are any changes that would be consistent with an MSA or PSP, any other structural causes that would contribute to her worsening outside of Parkinson's disease.      Plan:      Procedures    1. Parkinson's disease-chronic, severe condition  Continue Sinemet 2 tabs 4 times daily, can take overnight as well for a 5th dose  Titrate off of Requip over the next 2 weeks.  Then, at Neupro 2 mg patch.  If no side effects can go up to 4 mg in 4 weeks.  We will need to closely monitor blood pressure fluctuations or leg swelling    2. depression/anxiety  cont Celexa 10 mg daily    3.  Orthostatic hypotension  For now,we will continue Midodrin and Florinef  Hopefully will improve with improving dopamine medications  Could consider adding Northera    4. rBD  Can try melatonin  If it worsens, may need to add clonazepam    5.  Gait instability  Physical therapy    Kateri Plummer, MD, PhD  [facility] Neurology

## 2016-03-11 ENCOUNTER — Other Ambulatory Visit (INDEPENDENT_AMBULATORY_CARE_PROVIDER_SITE_OTHER): Payer: Self-pay

## 2016-03-11 MED ORDER — ROPINIROLE HCL ER 2 MG PO TB24
4.0000 mg | ORAL_TABLET | Freq: Every evening | ORAL | 2 refills | Status: DC
Start: 2016-03-11 — End: 2016-05-14

## 2016-05-04 ENCOUNTER — Other Ambulatory Visit (INDEPENDENT_AMBULATORY_CARE_PROVIDER_SITE_OTHER): Payer: Self-pay

## 2016-05-04 DIAGNOSIS — R2681 Unsteadiness on feet: Secondary | ICD-10-CM

## 2016-05-04 DIAGNOSIS — G20 Parkinson's disease: Secondary | ICD-10-CM

## 2016-05-05 MED ORDER — ROTIGOTINE 2 MG/24HR TD PT24
1.0000 | MEDICATED_PATCH | Freq: Every day | TRANSDERMAL | 3 refills | Status: AC
Start: 2016-05-05 — End: ?

## 2016-05-14 ENCOUNTER — Ambulatory Visit: Payer: Medicare Other | Attending: Neurology

## 2016-05-14 ENCOUNTER — Encounter (INDEPENDENT_AMBULATORY_CARE_PROVIDER_SITE_OTHER): Payer: Self-pay | Admitting: Neurology

## 2016-05-14 ENCOUNTER — Ambulatory Visit (INDEPENDENT_AMBULATORY_CARE_PROVIDER_SITE_OTHER): Payer: Medicare Other | Admitting: Neurology

## 2016-05-14 VITALS — BP 133/90 | HR 87 | Ht 60.5 in

## 2016-05-14 DIAGNOSIS — G20 Parkinson's disease: Secondary | ICD-10-CM

## 2016-05-14 DIAGNOSIS — I951 Orthostatic hypotension: Secondary | ICD-10-CM

## 2016-05-14 DIAGNOSIS — R1310 Dysphagia, unspecified: Secondary | ICD-10-CM

## 2016-05-14 DIAGNOSIS — G4752 REM sleep behavior disorder: Secondary | ICD-10-CM

## 2016-05-14 DIAGNOSIS — R937 Abnormal findings on diagnostic imaging of other parts of musculoskeletal system: Secondary | ICD-10-CM | POA: Insufficient documentation

## 2016-05-14 DIAGNOSIS — F418 Other specified anxiety disorders: Secondary | ICD-10-CM

## 2016-05-14 DIAGNOSIS — R2681 Unsteadiness on feet: Secondary | ICD-10-CM

## 2016-05-14 DIAGNOSIS — R9089 Other abnormal findings on diagnostic imaging of central nervous system: Secondary | ICD-10-CM | POA: Insufficient documentation

## 2016-05-14 DIAGNOSIS — F419 Anxiety disorder, unspecified: Secondary | ICD-10-CM

## 2016-05-14 NOTE — Progress Notes (Signed)
Subjective:       Patient ID: Connie Pham is a 52 y.o. female.    CC: Parkinson's disease              51 year old female presenting to neurology clinic for f/u of Parkinson's disease. She previously stop the Requip, and tried the Neupro patch instead.  Unfortunate, it still made her feel dizzy.  She was having some leg swelling.  She is not sure how much was helping her movements.  Her speech is also getting a bit more slurred, some difficulty swallowing, but no significant coughing or choking.  He feels generally unsteady, and is having much more difficulty standing and with transfers.  Still taking the Sinemet, no noticed side effects, but again is not noticing that it is helping too much.  Still feels very tired even little activity.  Not able to get much exercise.      No memory decline.  No confusion or hallucinations.    She is still waking often overnight, but no RBD.    No constipation.    Of note, she had a DAT scan in 2015 that showed reduced dopaminergic transmission.    As to before, denies smoking, denies drinking, denies drug use.  No family history of tremors or Parkinson's disease.    The following portions of the patient's history were reviewed and updated as appropriate: allergies, current medications, past family history, past medical history, past social history, past surgical history and problem list.    Review of Systems  Per HPI.  No recent illness.  Denies fever, chills, cough, sinus pain, eye pain, eye redness, ear pain, rhinorrhea, sore throat, chest pain, SOB, wheezing, abd pain, Nausea, Vomiting, diarrhea, dysuria, or rashes.  All other ROS negative.         Objective:    Physical Exam  Vitals:    05/14/16 1326   BP: 133/90   Pulse: 87       Constitutional: NAD   Eyes: Clear conjunctiva  Neck: Nontender, full ROM, No Lhermitte's sign  Cardiovascular: RRR  Resipratory: CTA B   Musculoskeletal: Normal muscle bulk.  No muscle pain  Integumentary: No abnormal rash noted  Psych: Normal  affect    Neurological:   Mental Status: Alert & oriented to person, place, month, & year.   Language: Spontaneous & fluent speech, good comprehension.  Speech is much more slurred this appointment.    Cranial Nerves:  II, III: PERRL, VFF  III, IV, VI: Disconjugate at baseline, but EOMI, No nystagmus, no palsies, no ptosis.  Notable saccadic movements bilaterally.  V: Intact to LT V1-V3 distribution bilaterally.   VI: Symmetrical face and expression. Mild hypomimia  VIII: Hearing intact to finger rub bilaterally.   IX, X: Palate/Uvula elevates symmetrically.   XI: 5/5 Trapezius & SCM bilaterally.   XII: Tongue is midline.     Motor Exam: Normal bulk. No clonus. No pronator drift. Strength 5/5 throughout and symmetric.Increased tone in all extremities, more so on right arm, right leg.  Decreased fine motor movement in right arm, mild intermittent resting tremor in right arm.    Sensation: Sensation to LT intact grossly throughout symmetrically. Romberg negative    Cerebellar:   Finger to Nose: Postural and intention tremor bilaterally, some dysmetria bilaterally on exam today.    Reflexes: 2+ throughout, symmetric, with down-going toes.    Station/ Gait: Arrives in a wheelchair, requires assistance to stand.    UPDRS-22, no dyskinesias currently  Assessment:       Patient was diagnosed Parkinson's disease in 2014, DAT scan +, currently with a moderate degree of parkinsonian symptoms, but already with some motor fluctuations, and notable nonmotor symptoms of RBD, insomnia, anxiety, depression, and apparent orthostatic hypotension.  Her motor score has certainly worsened and UPDRS is now 22. Unfortunately, today her speech seems much more slurred and she is having more dysmetria to her hand movements.  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  This would explain why she is not getting good benefit from medications, and she is  dramatically worsening over the last 2 years, even at a relatively young age.    Titrate off Neupro patch, continue on Sinemet for now.    She will need speech therapy for worsening speech and swallowing difficulties, and in the future, we will need to continue physical therapy.    As before, she would  also likely benefit from increasing Celexa to help with depression and anxiety.    Lastly, given the rapid progression get an MRI brain to see if there are any changes that would be consistent with an MSA or PSP, any other structural causes that would contribute to her worsening outside of Parkinson's disease.      Plan:      Procedures    1. Parkinson's disease-chronic, severe, Progressively worsening condition  Continue Sinemet 2 tabs 4 times daily, can take overnight as well for a 5th dose  Stop Neupro patch  We will get MRI to assess for any structural changes that could suggest MSA.    2. depression/anxiety  cont Celexa 10 mg daily    3.  Orthostatic hypotension  For now,we will continue Midodrin and Florinef  Hopefully will improve with improving dopamine medications  May need to consider adding Northera    4. rBD  Can try melatonin  If it worsens, may need to add clonazepam    5.  Gait instability  Physical therapy    Kateri Plummer, MD, PhD  [facility]

## 2016-05-18 ENCOUNTER — Encounter (INDEPENDENT_AMBULATORY_CARE_PROVIDER_SITE_OTHER): Payer: Self-pay | Admitting: Neurology

## 2016-06-25 ENCOUNTER — Ambulatory Visit (INDEPENDENT_AMBULATORY_CARE_PROVIDER_SITE_OTHER): Payer: Medicare Other | Admitting: Neurology

## 2016-08-04 ENCOUNTER — Encounter (INDEPENDENT_AMBULATORY_CARE_PROVIDER_SITE_OTHER): Payer: Self-pay

## 2016-08-19 ENCOUNTER — Ambulatory Visit (INDEPENDENT_AMBULATORY_CARE_PROVIDER_SITE_OTHER): Payer: Medicare Other | Admitting: Neurology

## 2016-08-19 ENCOUNTER — Encounter (INDEPENDENT_AMBULATORY_CARE_PROVIDER_SITE_OTHER): Payer: Self-pay | Admitting: Neurology

## 2016-08-19 VITALS — BP 97/63 | HR 99 | Ht 60.5 in | Wt 190.0 lb

## 2016-08-19 DIAGNOSIS — I951 Orthostatic hypotension: Secondary | ICD-10-CM

## 2016-08-19 DIAGNOSIS — G4752 REM sleep behavior disorder: Secondary | ICD-10-CM

## 2016-08-19 DIAGNOSIS — R2681 Unsteadiness on feet: Secondary | ICD-10-CM

## 2016-08-19 DIAGNOSIS — G239 Degenerative disease of basal ganglia, unspecified: Secondary | ICD-10-CM

## 2016-08-19 DIAGNOSIS — F418 Other specified anxiety disorders: Secondary | ICD-10-CM

## 2016-08-19 DIAGNOSIS — G903 Multi-system degeneration of the autonomic nervous system: Secondary | ICD-10-CM

## 2016-08-19 DIAGNOSIS — F32A Depression, unspecified: Secondary | ICD-10-CM

## 2016-08-19 NOTE — Progress Notes (Signed)
Subjective:       Patient ID: Connie Pham is a 51 y.o. female.    CC: Parkinson's disease              51 y.o.  female presenting to neurology clinic for f/u of possible Parkinson's disease and for Dopamine challenge.  She did not take her Sinemet, but is wearing the Neupro patch the day.  She thinks she is much more stiff, and leaning more important to the right.  Her sister noticed that when she was helping to the bathroom, she was able to stand up without feeling so lightheaded, and she did not see much is going to pass out.  Additionally, she felt she could not support her legs better without the medicine.    Otherwise, no major changes.  Proximal occasional difficulty swallowing but no coughing or choking.  Occasional drooling.  She is not noticing any side effects with the low dose of Neupro, and the Sinemet.  She is currently taking Sinemet 1.5 tabs 3 times daily.  Still not getting much activity or exercise.    No memory decline.  No confusion or hallucinations.    She is still waking often overnight, but no RBD recently    No constipation.    As noted previously, she had a DAT scan in 2015 that showed reduced dopaminergic transmission.    As to before, denies smoking, denies drinking, denies drug use.  No family history of tremors or Parkinson's disease.    The following portions of the patient's history were reviewed and updated as appropriate: allergies, current medications, past family history, past medical history, past social history, past surgical history and problem list.    Review of Systems  Per HPI.  No recent illness.  Denies fever, chills, cough, sinus pain, eye pain, eye redness, ear pain, rhinorrhea, sore throat, chest pain, SOB, wheezing, abd pain, Nausea, Vomiting, diarrhea, dysuria, or rashes.  All other ROS negative.         Objective:    Physical Exam  Vitals:    08/19/16 1249   BP: 97/63   Pulse: 99       Constitutional: NAD   Eyes: Clear conjunctiva  Neck: Nontender, full ROM, No  Lhermitte's sign  Cardiovascular: RRR  Resipratory: CTA B   Musculoskeletal: Normal muscle bulk.  No muscle pain  Integumentary: No abnormal rash noted  Psych: Normal affect    Neurological:   Mental Status: Alert & oriented to person, place, month, & year.   Language: Spontaneous & fluent speech, good comprehension.  Speech is much more slurred this appointment.    Cranial Nerves:  II, III: PERRL, VFF  III, IV, VI: Disconjugate at baseline, but EOMI, No nystagmus, no palsies, no ptosis.  Notable saccadic movements bilaterally.  V: Intact to LT V1-V3 distribution bilaterally.   VI: Symmetrical face and expression. Mild hypomimia  VIII: Hearing intact to finger rub bilaterally.   IX, X: Palate/Uvula elevates symmetrically.   XI: 5/5 Trapezius & SCM bilaterally.   XII: Tongue is midline.     Motor Exam: Normal bulk. No clonus. No pronator drift. Strength 5/5 throughout and symmetric.Increased tone in all extremities, more so on right arm, right leg.  Decreased fine motor movement in right arm, mild intermittent resting tremor in right arm.    Sensation: Sensation to LT intact grossly throughout symmetrically. Romberg negative    Cerebellar:   Finger to Nose: Postural and intention tremor bilaterally, some dysmetria bilaterally on exam today.  Reflexes: 2+ throughout, symmetric, with down-going toes.    Station/ Gait: Arrives in a wheelchair, requires assistance to stand.    UPDRS- 25 before Sinemet      UPDRS- 21 after Sinemet      Assessment:       Patient was diagnosed Parkinson's disease in 2014, DAT scan +, currently with a moderate degree of parkinsonian symptoms, but already with some motor fluctuations, and notable nonmotor symptoms of RBD, insomnia, anxiety, depression, and apparent orthostatic hypotension.  During dopamine challenged today, she had most had some mild benefit from the Sinemet to actively patient feels less stiff on the Sinemet.  That said, her sister notes that she had an easier time  standing and moving with less orthostatic hypotension when she had not had her dopamine earlier today.    With this minimal benefit from levodopa, and recent MRI brain does show significant atrophy in the brainstem, pons, cerebellum and cerebral peduncles, this is most likely MSA.  This would explain why she is not getting good benefit from medications, and she is dramatically worsening over the last 2 years, even at a relatively young age.    We will continue neupro 2 mg patch, but gradually titrate down on Sinemet over the next 2 months.  Hopefully find a balance between some benefit for stiffness, and worsening of her orthostatic hypotension.    She will need speech therapy for worsening speech and swallowing difficulties, and in the future, we will need to continue physical therapy.    We will continue Celexa to help with depression and anxiety.        Plan:      Procedures    1. MSA-Mixed type-chronic, severe, Progressively worsening condition  Gradually titrate down Sinemet over the next 2 months, to 1 tab or half tab 3 times daily.  Continue 2 mg Neupro patch, as is at least helps with her restless legs syndrome.      2. depression/anxiety  cont Celexa 10 mg daily    3.  Orthostatic hypotension  For now,we will continue Midodrin and Florinef  Hopefully will improve with cutting back on dopamine medications  May need to consider adding Northera    4. rBD  Can try melatonin  If it worsens, may need to add clonazepam    5.  Gait instability  Physical therapy    Kateri Plummer, MD, PhD  [facility]     More than 50% of this 45 min office visit was spent counseling the patient on:   MSA vs PD, progression, treatment options, potential worsening with some medications.    Kateri Plummer, MD, PhD  [facility]

## 2016-10-21 ENCOUNTER — Ambulatory Visit (INDEPENDENT_AMBULATORY_CARE_PROVIDER_SITE_OTHER): Payer: Medicare Other | Admitting: Neurology

## 2016-11-07 DEATH — deceased

## 2016-11-11 ENCOUNTER — Other Ambulatory Visit (INDEPENDENT_AMBULATORY_CARE_PROVIDER_SITE_OTHER): Payer: Self-pay | Admitting: Neurology

## 2016-11-11 DIAGNOSIS — G20 Parkinson's disease: Secondary | ICD-10-CM

## 2016-11-11 DIAGNOSIS — R2681 Unsteadiness on feet: Secondary | ICD-10-CM
